# Patient Record
Sex: Male | Born: 1980 | Hispanic: No | Marital: Single | Smoking: Current every day smoker
Health system: Southern US, Community
[De-identification: ages and names within clinical notes are randomized; demographics above are authoritative.]

## PROBLEM LIST (undated history)

## (undated) DIAGNOSIS — I10 Essential (primary) hypertension: Secondary | ICD-10-CM

## (undated) DIAGNOSIS — E119 Type 2 diabetes mellitus without complications: Secondary | ICD-10-CM

---
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
## 2001-05-19 ENCOUNTER — Emergency Department (HOSPITAL_COMMUNITY): Admission: EM | Admit: 2001-05-19 | Discharge: 2001-05-19 | Payer: Self-pay | Admitting: Emergency Medicine

## 2001-05-19 ENCOUNTER — Encounter: Payer: Self-pay | Admitting: Emergency Medicine

## 2012-09-25 ENCOUNTER — Encounter (HOSPITAL_COMMUNITY): Payer: Self-pay | Admitting: Emergency Medicine

## 2012-09-25 ENCOUNTER — Emergency Department (HOSPITAL_COMMUNITY)
Admission: EM | Admit: 2012-09-25 | Discharge: 2012-09-25 | Disposition: A | Discharge status: Home / Self Care | Payer: Self-pay | Attending: Emergency Medicine | Admitting: Emergency Medicine

## 2012-09-25 DIAGNOSIS — S39012A Strain of muscle, fascia and tendon of lower back, initial encounter: Secondary | ICD-10-CM

## 2012-09-25 DIAGNOSIS — Y9389 Activity, other specified: Secondary | ICD-10-CM | POA: Insufficient documentation

## 2012-09-25 DIAGNOSIS — X503XXA Overexertion from repetitive movements, initial encounter: Secondary | ICD-10-CM | POA: Insufficient documentation

## 2012-09-25 DIAGNOSIS — F17200 Nicotine dependence, unspecified, uncomplicated: Secondary | ICD-10-CM | POA: Insufficient documentation

## 2012-09-25 DIAGNOSIS — Y929 Unspecified place or not applicable: Secondary | ICD-10-CM | POA: Insufficient documentation

## 2012-09-25 DIAGNOSIS — S335XXA Sprain of ligaments of lumbar spine, initial encounter: Secondary | ICD-10-CM | POA: Insufficient documentation

## 2012-09-25 MED ORDER — IBUPROFEN 800 MG PO TABS: 800.0000 mg | ORAL_TABLET | Freq: Three times a day (TID) | ORAL | Status: DC

## 2012-09-25 MED ORDER — METHOCARBAMOL 500 MG PO TABS: 500.0000 mg | ORAL_TABLET | Freq: Two times a day (BID) | ORAL | Status: DC

## 2012-09-25 MED ORDER — IBUPROFEN 400 MG PO TABS
800.0000 mg | ORAL_TABLET | Freq: Once | ORAL | Status: AC
  Administered 2012-09-25: 800 mg via ORAL
  Filled 2012-09-25: qty 2

## 2012-09-25 MED ORDER — METHOCARBAMOL 500 MG PO TABS
500.0000 mg | ORAL_TABLET | Freq: Once | ORAL | Status: AC
  Administered 2012-09-25: 500 mg via ORAL
  Filled 2012-09-25: qty 1

## 2012-09-25 NOTE — ED Notes (Signed)
Pt. Stated, My back is hurting, I don't know If its a pulled muscle or what.

## 2012-09-25 NOTE — ED Provider Notes (Signed)
History     CSN: [id number]  Arrival date & time 09/25/12  0903   First MD Initiated Contact with Patient 09/25/12 [phone]      Chief Complaint  Patient presents with  . Back Pain    (Consider location/radiation/quality/duration/timing/severity/associated sxs/prior treatment) HPI Comments: Sharp right-sided low back pain x2 days. Pain is nonradiating and worse with movement and palpation. Patient has tried Tylenol without relief. Patient ambulatory without difficulty. Patient denies bowel or bladder incontinence and saddle anesthesia as well as numbness or tingling in his extremities, extremity weakness, and inability to ambulate. Also denies direct recent trauma or injury to his back but has been doing a lot of lifting lately.  Patient is a 32 y.o. male presenting with back pain. The history is provided by the patient. No language interpreter was used.  Back Pain Associated symptoms: no fever, no numbness and no weakness     History reviewed. No pertinent past medical history.  History reviewed. No pertinent past surgical history.  No family history on file.  History  Substance Use Topics  . Smoking status: Current Every Day Smoker  . Smokeless tobacco: Not on file  . Alcohol Use: No      Review of Systems  Constitutional: Negative for fever.  Musculoskeletal: Positive for back pain. Negative for gait problem.  Skin: Negative for color change and wound.  Neurological: Negative for weakness and numbness.  All other systems reviewed and are negative.    Allergies  Review of patient's allergies indicates no known allergies.  Home Medications   Current Outpatient Rx  Name  Route  Sig  Dispense  Refill  . acetaminophen (TYLENOL) 500 MG tablet   Oral   Take 1,000 mg by mouth every 6 (six) hours as needed for pain (For back pain.).         Marland Kitchen ibuprofen (ADVIL,MOTRIN) 800 MG tablet   Oral   Take 1 tablet (800 mg total) by mouth 3 (three) times daily.   21 tablet  0   . methocarbamol (ROBAXIN) 500 MG tablet   Oral   Take 1 tablet (500 mg total) by mouth 2 (two) times daily.   20 tablet   0     BP 160/96  Pulse 72  Temp(Src) 97.8 F (36.6 C) (Oral)  Resp 16  SpO2 96%  Physical Exam  Nursing note and vitals reviewed. Constitutional: He is oriented to person, place, and time. He appears well-developed and well-nourished. No distress.  HENT:  Head: Normocephalic and atraumatic.  Eyes: Conjunctivae and EOM are normal. No scleral icterus.  Neck: Normal range of motion.  Cardiovascular: Normal rate, regular rhythm and intact distal pulses.   DP and PT pulses 2+ b/l. Capillary refill normal.  Pulmonary/Chest: Effort normal. No respiratory distress.  Musculoskeletal:       Cervical back: Normal.       Thoracic back: Normal.       Lumbar back: He exhibits tenderness and spasm. He exhibits normal range of motion, no bony tenderness, no swelling, no deformity and no pain.       Back:  TTP of lumbar paraspinal muscles with spasm. No cervical, thoracic, or lumbar midline TTP. No bony deformities or step offs palpated.  Neurological: He is alert and oriented to person, place, and time. He has normal reflexes.  No sensory or motor deficits. Patient ambulatory with normal gait.  Skin: Skin is warm and dry. No rash noted. He is not diaphoretic. No erythema. No pallor.  Psychiatric: He has a normal mood and affect. His behavior is normal.    ED Course  Procedures (including critical care time)  Labs Reviewed - No data to display No results found.   1. Low back strain, initial encounter     MDM  Uncomplicated low back strain - Patient neurovascularly intact and ambulatory. No signs concerning for cauda equina. No midline tenderness or hx of direct trauma/blow/fall. Patient appropriate for d/c with orthopedic follow up if symptoms do not improve with ice, ibuprofen, and robaxin. Indications for ED return discussed with patient who verbalizes  comfort and understanding with plan.        Antony Madura, PA-C 09/30/12 1131

## 2012-09-25 NOTE — ED Notes (Signed)
Pt states he hurt his back 2 days ago after lifting something heavy. C/O right lower back pain.

## 2012-10-05 NOTE — ED Provider Notes (Signed)
Medical screening examination/treatment/procedure(s) were performed by non-physician practitioner and as supervising physician I was immediately available for consultation/collaboration.  Opal Dinning, MD 10/05/12 0732 

## 2012-10-16 ENCOUNTER — Emergency Department (HOSPITAL_COMMUNITY)
Admission: EM | Admit: 2012-10-16 | Discharge: 2012-10-16 | Disposition: A | Discharge status: Home / Self Care | Payer: Self-pay | Attending: Emergency Medicine | Admitting: Emergency Medicine

## 2012-10-16 ENCOUNTER — Emergency Department (HOSPITAL_COMMUNITY): Payer: Self-pay

## 2012-10-16 ENCOUNTER — Encounter (HOSPITAL_COMMUNITY): Payer: Self-pay | Admitting: Adult Health

## 2012-10-16 DIAGNOSIS — IMO0002 Reserved for concepts with insufficient information to code with codable children: Secondary | ICD-10-CM | POA: Insufficient documentation

## 2012-10-16 DIAGNOSIS — F17200 Nicotine dependence, unspecified, uncomplicated: Secondary | ICD-10-CM | POA: Insufficient documentation

## 2012-10-16 DIAGNOSIS — Y9389 Activity, other specified: Secondary | ICD-10-CM | POA: Insufficient documentation

## 2012-10-16 DIAGNOSIS — W19XXXA Unspecified fall, initial encounter: Secondary | ICD-10-CM

## 2012-10-16 DIAGNOSIS — Y92009 Unspecified place in unspecified non-institutional (private) residence as the place of occurrence of the external cause: Secondary | ICD-10-CM | POA: Insufficient documentation

## 2012-10-16 DIAGNOSIS — W138XXA Fall from, out of or through other building or structure, initial encounter: Secondary | ICD-10-CM | POA: Insufficient documentation

## 2012-10-16 DIAGNOSIS — M549 Dorsalgia, unspecified: Secondary | ICD-10-CM

## 2012-10-16 MED ORDER — HYDROCODONE-ACETAMINOPHEN 5-325 MG PO TABS
1.0000 | ORAL_TABLET | Freq: Once | ORAL | Status: AC
  Administered 2012-10-16: 1 via ORAL
  Filled 2012-10-16: qty 1

## 2012-10-16 MED ORDER — DIAZEPAM 5 MG PO TABS
5.0000 mg | ORAL_TABLET | Freq: Once | ORAL | Status: AC
  Administered 2012-10-16: 5 mg via ORAL
  Filled 2012-10-16: qty 1

## 2012-10-16 MED ORDER — DIAZEPAM 5 MG PO TABS: 5.0000 mg | ORAL_TABLET | Freq: Two times a day (BID) | ORAL | Status: DC

## 2012-10-16 MED ORDER — HYDROCODONE-ACETAMINOPHEN 5-325 MG PO TABS: 1.0000 | ORAL_TABLET | ORAL | Status: DC | PRN

## 2012-10-16 NOTE — ED Notes (Addendum)
Presents post fall from a one story roof home onto grass landed on back, denies hitting head, denies LOC denies neck pain. C/o lower back pain. Able to ambulate and maex4. Pain is worse with ambulation and better with rest. Denies numbness and tingling. Accident occurred at noon today.

## 2012-10-16 NOTE — ED Provider Notes (Signed)
Medical screening examination/treatment/procedure(s) were performed by non-physician practitioner and as supervising physician I was immediately available for consultation/collaboration.   Dail Meece, MD 10/16/12 2324 

## 2012-10-16 NOTE — ED Provider Notes (Signed)
History  This chart was scribed for non-physician practitioner working with [name], MD. This patient was seen in room TR10C/TR10C and the patient's care was started at 9:18 PM.  CSN: [id number]  Arrival date & time 10/16/12  2055   Chief Complaint  Patient presents with  . Fall    The history is provided by the patient. No language interpreter was used.   HPI Comments: Joshua Mcneil is a 32 y.o. male who presents to the Emergency Department complaining of sudden onset, gradually worsening, constant, moderate-to-severe, non-radiating middle lower back pain onset after an accidental fall that occurred 9 hours ago. Pt states that he was working on a roof of a one story building, when he fell backwards off of the roof and landed with his back on the grass below. Pt states that he landed on his back and his right side. He denies head injury or LOC. He does not have any cuts, scrapes or bruises. Pt states that his pain is relieved with rest and worsened with movement. Pt rates his pain at a "6/10" at rest and a "9/10" with aggravating movements. Denies neck pain, chest pain, nausea, vomiting,  numbness, weakness, tingling, bowel incontinence, bladder incontinence and saddle anaesthesia.  PCP- None   History reviewed. No pertinent past medical history.  History reviewed. No pertinent past surgical history.  History reviewed. No pertinent family history.  History  Substance Use Topics  . Smoking status: Current Every Day Smoker  . Smokeless tobacco: Not on file  . Alcohol Use: No    Review of Systems  Constitutional: Negative for fever and chills.  HENT: Negative for congestion, sore throat, rhinorrhea and neck pain.   Eyes: Negative for visual disturbance.  Respiratory: Negative for cough and shortness of breath.   Cardiovascular: Negative for chest pain.  Gastrointestinal: Negative for nausea, vomiting, abdominal pain and diarrhea.  Genitourinary: Negative for  dysuria.  Musculoskeletal: Positive for back pain.  Skin: Negative for rash.  Neurological: Negative for syncope and headaches.  Psychiatric/Behavioral: Negative for confusion.  A complete 10 system review of systems was obtained and all systems are negative except as noted in the HPI and PMH.   Allergies  Review of patient's allergies indicates no known allergies.  Home Medications   Current Outpatient Rx  Name  Route  Sig  Dispense  Refill  . acetaminophen (TYLENOL) 500 MG tablet   Oral   Take 1,000 mg by mouth every 6 (six) hours as needed for pain (For back pain.).         Marland Kitchen ibuprofen (ADVIL,MOTRIN) 800 MG tablet   Oral   Take 1 tablet (800 mg total) by mouth 3 (three) times daily.   21 tablet   0   . methocarbamol (ROBAXIN) 500 MG tablet   Oral   Take 1 tablet (500 mg total) by mouth 2 (two) times daily.   20 tablet   0    Triage Vitals: BP 128/70  Pulse 99  Temp(Src) 97.8 F (36.6 C) (Oral)  Resp 18  SpO2 99%  Physical Exam  Nursing note and vitals reviewed. Constitutional: He is oriented to person, place, and time. He appears well-developed and well-nourished. No distress.  HENT:  Head: Normocephalic and atraumatic.  Eyes: Conjunctivae and EOM are normal.  Neck: Normal range of motion. Neck supple.  Cardiovascular: Normal rate, regular rhythm and normal heart sounds.   Pulmonary/Chest: Effort normal and breath sounds normal.  Musculoskeletal: Normal range of motion. He exhibits no  edema.  Non-tender in his lumbosacral spine. Pain to lower back when raising right leg. Full lumbar ROM. Slow but normal gait.  Neurological: He is alert and oriented to person, place, and time.  Sensation intact. Strength LE 5/5 and equal bilaterally.  Skin: Skin is warm and dry.  No bruising, ecchymosis, abrasions or lacerations.  Psychiatric: He has a normal mood and affect. His behavior is normal.    ED Course  Procedures (including critical care time)  DIAGNOSTIC  STUDIES: Oxygen Saturation is 99% on RA, normal by my interpretation.    COORDINATION OF CARE: 9:26 PM- Pt advised of plan for treatment and pt agrees.  Medications  diazepam (VALIUM) tablet 5 mg (5 mg Oral Given 10/16/12 2130)  HYDROcodone-acetaminophen (NORCO/VICODIN) 5-325 MG per tablet 1 tablet (1 tablet Oral Given 10/16/12 2130)    Labs Reviewed - No data to display  Dg Lumbar Spine Complete  10/16/2012   *RADIOLOGY REPORT*  Clinical Data: Low back pain after fall earlier today.  LUMBAR SPINE - COMPLETE 4+ VIEW  Comparison: None.  Findings: Five lumbar type vertebrae.  Normal alignment of the lumbar vertebrae and facet joints.  Degenerative changes at L2-3 with mild disc space narrowing and endplate hypertrophic changes. No vertebral compression deformities.  No focal bone lesion or bone destruction.  Bone cortex and trabecular architecture appear intact.  IMPRESSION: No acute displaced fractures demonstrated in the lumbar spine. Mild degenerative changes at L2-3 level.   Original Report Authenticated By: Burman Nieves, M.D.   Dg Sacrum/coccyx  10/16/2012   *RADIOLOGY REPORT*  Clinical Data: Low back pain after fall earlier today.  SACRUM AND COCCYX - 2+ VIEW  Comparison: None.  Findings: The SI joints and sacral struts appear intact and symmetrical.  Normal alignment of the sacral coccygeal spine.  No focal bone lesion or bone destruction.  The bone cortex appears intact.  IMPRESSION: No displaced fractures identified in the sacral coccygeal spine.   Original Report Authenticated By: Burman Nieves, M.D.    1. Fall, initial encounter   2. Back pain     MDM  Patient with low-back pain after sustaining a fall. X-rays normal. He is able to ambulate slowly but normally. No flags concerning patient's back pain. No signs of cauda equina or focal neurologic deficits. Discharge with Vicodin and Valium. Return precautions discussed. Patient states understanding of plan and is  agreeable.      I personally performed the services described in this documentation, which was scribed in my presence. The recorded information has been reviewed and is accurate.   Trevor Mace, PA-C 10/16/12 2213

## 2016-01-04 ENCOUNTER — Ambulatory Visit (INDEPENDENT_AMBULATORY_CARE_PROVIDER_SITE_OTHER): Payer: Worker's Compensation

## 2016-01-04 ENCOUNTER — Ambulatory Visit (INDEPENDENT_AMBULATORY_CARE_PROVIDER_SITE_OTHER): Payer: Worker's Compensation | Admitting: Physician Assistant

## 2016-01-04 ENCOUNTER — Encounter: Payer: Self-pay | Admitting: Physician Assistant

## 2016-01-04 VITALS — BP 132/88 | HR 104 | Temp 98.7°F | Resp 16 | Wt 288.4 lb

## 2016-01-04 DIAGNOSIS — W132XXA Fall from, out of or through roof, initial encounter: Secondary | ICD-10-CM

## 2016-01-04 DIAGNOSIS — M25572 Pain in left ankle and joints of left foot: Secondary | ICD-10-CM

## 2016-01-04 DIAGNOSIS — R0781 Pleurodynia: Secondary | ICD-10-CM

## 2016-01-04 DIAGNOSIS — Z0289 Encounter for other administrative examinations: Secondary | ICD-10-CM

## 2016-01-04 DIAGNOSIS — M5489 Other dorsalgia: Secondary | ICD-10-CM

## 2016-01-04 DIAGNOSIS — M542 Cervicalgia: Secondary | ICD-10-CM

## 2016-01-04 DIAGNOSIS — Z026 Encounter for examination for insurance purposes: Secondary | ICD-10-CM

## 2016-01-04 DIAGNOSIS — R0789 Other chest pain: Secondary | ICD-10-CM

## 2016-01-04 LAB — POCT URINALYSIS DIP (MANUAL ENTRY)
BILIRUBIN UA: NEGATIVE
Blood, UA: NEGATIVE
Glucose, UA: NEGATIVE
Ketones, POC UA: NEGATIVE
LEUKOCYTES UA: NEGATIVE
NITRITE UA: NEGATIVE
PH UA: 8.5
PROTEIN UA: NEGATIVE
Spec Grav, UA: 1.02
Urobilinogen, UA: 1

## 2016-01-04 MED ORDER — CYCLOBENZAPRINE HCL 10 MG PO TABS: 5.0000 mg | ORAL_TABLET | Freq: Three times a day (TID) | ORAL | 0 refills | Status: DC | PRN

## 2016-01-04 MED ORDER — MELOXICAM 15 MG PO TABS: 15.0000 mg | ORAL_TABLET | Freq: Every day | ORAL | 0 refills | Status: DC

## 2016-01-04 NOTE — Patient Instructions (Signed)
Okay to take 1000 mg of Tylenol every eight hours as needed for pain along with Meloxicam and Flexeril as prescribed.

## 2016-01-04 NOTE — Progress Notes (Signed)
01/04/2016 2:46 PM   DOB: 12/21/1980 / MRN: [id number]  SUBJECTIVE:  Joshua ClauseJose Carlos Mcneil is a 35 y.o. male presenting for ankle pain and back pain that started today after falling off of a roof from a 10 foot heigth.  He complains of generalized left sided ankle pain but is ambulating without difficulty.  He complains of cervical, thoracic, and lumbar pain along with right sided rib pain.   He has No Known Allergies.   He  has no past medical history on file.    He  reports that he has been smoking.  He does not have any smokeless tobacco history on file. He reports that he does not drink alcohol or use drugs. He  has no sexual activity history on file. The patient  has no past surgical history on file.  His family history is not on file.  Review of Systems  Constitutional: Negative for chills and fever.  Respiratory: Negative for cough.   Cardiovascular: Negative for chest pain.  Gastrointestinal: Negative for abdominal pain.  Genitourinary: Negative for hematuria.  Musculoskeletal: Positive for back pain, falls, joint pain, myalgias and neck pain.  Skin: Negative for itching and rash.  Neurological: Negative for dizziness, tingling, tremors, sensory change, speech change, focal weakness, seizures, loss of consciousness and headaches.    The problem list and medications were reviewed and updated by myself where necessary and exist elsewhere in the encounter.   OBJECTIVE:  BP 132/88 (Cuff Size: Large)   Pulse (!) 104   Temp 98.7 F (37.1 C)   Resp 16   Wt 288 lb 6.4 oz (130.8 kg)   SpO2 96%   Physical Exam  Constitutional: He is oriented to person, place, and time. He appears well-developed and well-nourished.  Cardiovascular: Normal rate, regular rhythm and normal heart sounds.   Pulmonary/Chest: Effort normal and breath sounds normal.  Musculoskeletal: Normal range of motion.  Neurological: He is alert and oriented to person, place, and time.  Skin: Skin is warm and dry.     Results for orders placed or performed in visit on 01/04/16 (from the past 72 hour(s))  POCT urinalysis dipstick     Status: None   Collection Time: 01/04/16  1:30 PM  Result Value Ref Range   Color, UA yellow yellow   Clarity, UA clear clear   Glucose, UA negative negative   Bilirubin, UA negative negative   Ketones, POC UA negative negative   Spec Grav, UA 1.020    Blood, UA negative negative   pH, UA 8.5    Protein Ur, POC negative negative   Urobilinogen, UA 1.0    Nitrite, UA Negative Negative   Leukocytes, UA Negative Negative    Dg Ribs Unilateral Right  Result Date: 01/04/2016 CLINICAL DATA:  Fall from roof today, right rib pain EXAM: RIGHT RIBS - 2 VIEW COMPARISON:  None. FINDINGS: Three views right ribs submitted. No infiltrate or pulmonary edema. Mild elevation of the right hemidiaphragm. No definite rib fracture is identified. There is no pneumothorax. IMPRESSION: No definite rib fracture is identified. No pneumothorax. No infiltrate or pulmonary edema. Electronically Signed   By: Natasha MeadLiviu  Pop M.D.   On: 01/04/2016 14:34   Dg Cervical Spine 2 Or 3 Views  Result Date: 01/04/2016 CLINICAL DATA:  Back and neck pain after fall from roof. Initial encounter. EXAM: CERVICAL SPINE - 2-3 VIEW COMPARISON:  None. FINDINGS: There is no evidence of cervical spine fracture or prevertebral soft tissue swelling. Alignment is normal.  No other significant bone abnormalities are identified. Limited visualization of C7 and cervicothoracic junction on swimmer's view. IMPRESSION: 1. No evidence of injury. 2. Limited visualization of the craniocervical junction. Electronically Signed   By: [name] M.D.   On: 01/04/2016 14:28   Dg Thoracic Spine 2 View  Result Date: 01/04/2016 CLINICAL DATA:  Fall from roof with back pain.  Initial encounter. EXAM: THORACIC SPINE 2 VIEWS COMPARISON:  None. FINDINGS: There is no evidence of thoracic spine fracture. Alignment is normal. No other significant  bone abnormalities are identified. IMPRESSION: Negative. Electronically Signed   By: [name] M.D.   On: 01/04/2016 14:29   Dg Lumbar Spine 2-3 Views  Result Date: 01/04/2016 CLINICAL DATA:  Fall from roof with back pain.  Initial encounter. EXAM: LUMBAR SPINE - 2-3 VIEW COMPARISON:  10/16/2012 FINDINGS: Chronic, physiologic appearing wedging at L1. Progressive spondylosis and disc narrowing at L2-3. No acute fracture or traumatic malalignment. IMPRESSION: 1. No acute finding. 2. Progressive spondylosis and disc narrowing at L2-3 when compared to 2014. Electronically Signed   By: [name] M.D.   On: 01/04/2016 14:31   Dg Ankle Complete Left  Result Date: 01/04/2016 CLINICAL DATA:  Status post fall with left ankle injury and left ankle pain. Fell 10 feet from roof while working. EXAM: LEFT ANKLE COMPLETE - 3+ VIEW COMPARISON:  None. FINDINGS: There is no acute fracture or dislocation. The ankle mortise is approximated. There is moderate circumferential soft tissue swelling. The visualized joint spaces of the midfoot and hindfoot are maintained. IMPRESSION: No fracture or dislocation of the left ankle. Electronically Signed   By: Deatra Robinson M.D.   On: 01/04/2016 14:28    ASSESSMENT AND PLAN  Marice was seen today for fall.  Diagnoses and all orders for this visit:   Encounter related to worker's compensation claim.   Left ankle pain: Rads are negative.  I have advised that he will be out of work at there very least until next Monday for follow up.  Mobic and flexeril for pain.  -     DG Ankle Complete Left; Future  Neck pain -     DG Cervical Spine 2 or 3 views; Future  Rib pain on right side -     DG Thoracic Spine 2 View; Future -     DG Ribs Unilateral Right; Future  Midline back pain, unspecified location -     DG Lumbar Spine 2-3 Views; Future  Fall from roof, initial encounter -     POCT urinalysis dipstick -     Recheck vitals -     meloxicam (MOBIC) 15 MG  tablet; Take 1 tablet (15 mg total) by mouth daily. -     cyclobenzaprine (FLEXERIL) 10 MG tablet; Take 0.5-1 tablets (5-10 mg total) by mouth 3 (three) times daily as needed for muscle spasms (May cause drowsiness. Do no operate heavy machinery while taking.).    The patient is advised to call or return to clinic if he does not see an improvement in symptoms, or to seek the care of the closest emergency department if he worsens with the above plan.   Deliah Boston, MHS, PA-C [facility] 01/04/2016 2:46 PM

## 2016-01-08 ENCOUNTER — Ambulatory Visit (INDEPENDENT_AMBULATORY_CARE_PROVIDER_SITE_OTHER): Payer: Worker's Compensation | Admitting: Urgent Care

## 2016-01-08 VITALS — BP 120/80 | HR 79 | Temp 98.2°F | Resp 18 | Ht 70.0 in | Wt 283.2 lb

## 2016-01-08 DIAGNOSIS — M25572 Pain in left ankle and joints of left foot: Secondary | ICD-10-CM

## 2016-01-08 DIAGNOSIS — W132XXD Fall from, out of or through roof, subsequent encounter: Secondary | ICD-10-CM

## 2016-01-08 DIAGNOSIS — Z026 Encounter for examination for insurance purposes: Secondary | ICD-10-CM

## 2016-01-08 DIAGNOSIS — M542 Cervicalgia: Secondary | ICD-10-CM

## 2016-01-08 DIAGNOSIS — S9032XD Contusion of left foot, subsequent encounter: Secondary | ICD-10-CM | POA: Diagnosis not present

## 2016-01-08 NOTE — Patient Instructions (Addendum)
RHCE para los cuidados de rutina de las lesiones (RICE for Routine Care of Injuries) Los cuidados de rutina de muchas lesiones incluyen reposo, hielo, compresin y elevacin (RHCE). A menudo se recomienda la estrategia de RHCE para las lesiones de los tejidos blandos, por ejemplo, una distensin muscular, las lesiones de los ligamentos, los hematomas y las lesiones por uso excesivo. Tambin se puede utilizar para [employer]. El uso de la Economist de RHCE puede ayudar a Engineer, materials, reducir la hinchazn y permitir que el cuerpo se recupere. Reposo El reposo es necesario para permitir que el cuerpo se recupere. Habitualmente, esto implica reducir las [address] normales y Automotive engineer el uso de la zona lesionada del cuerpo. Por lo general, puede reanudar sus actividades normales cuando se siente cmodo y el mdico se lo ha autorizado. Hielo Aplicar hielo en una lesin sirve para evitar la hinchazn y Engineer, materials. No aplique el hielo directamente sobre la piel.  Ponga el hielo en una bolsa plstica.  Coloque una toalla entre la piel y la bolsa de hielo.  Coloque el hielo durante 20 minutos, 2 a 3 veces por da. Hgalo durante el tiempo que el mdico se lo haya indicado. Compresin La compresin implica ejercer presin sobre la zona lesionada. La compresin ayuda a evitar la hinchazn, brinda sujecin y [place] con las [address] Una venda elstica sirve para la compresin. Si tiene IT consultant, siga estos consejos generales:  Qutese y vuelva a colocarse la venda cada 3 o 4horas, o como se lo haya indicado el mdico.  Asegrese de que la venda no est muy [place], ya que esto puede cortarle la circulacin. Si una parte del cuerpo ms all de la venda se torna color azul, se adormece, se enfra, se hincha o le causa ms dolor, es probable que la venda est [place]. Si eso ocurre, retire la venda y vuelva a colocarla ms floja.  Consulte al mdico si la venda parece  estar agravando los problemas en lugar de mejorndolos. Elevacin La elevacin implica mantener elevada la zona lesionada. Esto ayuda a Engineer, materials y Building services engineer. Si es posible, la zona lesionada debe elevarse al nivel del corazn o del centro del pecho, o por encima de [place]. CUNDO DEBO BUSCAR ATENCIN MDICA? Debe buscar atencin mdica en las siguientes situaciones:  El dolor y la hinchazn continan.  Los sntomas empeoran en vez de Scientist, clinical (histocompatibility and immunogenetics). Estos sntomas pueden indicar que es necesaria una evaluacin ms profunda o nuevas radiografas. En algunos casos, las radiografas no muestran si hay un hueso pequeo roto (fractura) hasta varios das ms tarde. Concurra a las citas de control con el mdico. CUNDO DEBO BUSCAR ASISTENCIA MDICA INMEDIATA? Solicite atencin [employer] en las siguientes situaciones:  Siente un dolor intenso repentino en la zona de la lesin o por debajo de esta.  Tiene enrojecimiento o aumenta la hinchazn alrededor de la lesin.  Tiene hormigueo o adormecimiento en la zona de la lesin o por debajo de esta que no mejoran despus de quitarse la venda Adult nurse.   Esta informacin no tiene Theme park manager el consejo del mdico. Asegrese de hacerle al mdico cualquier pregunta que tenga.   Document Released: 01/02/2005 Document Revised: 03/30/2013 Elsevier Interactive Patient Education [employer]     IF you received an x-ray today, you will receive an invoice from [facility]. Please contact [facility] at [phone] with questions or concerns regarding your invoice.   IF you received labwork today,  you will receive an invoice from [facility]. Please contact [facility] at [phone] with questions or concerns regarding your invoice.   Our billing staff will not be able to assist you with questions regarding bills from these companies.  You will be contacted with the lab  results as soon as they are available. The fastest way to get your results is to activate your My Chart account. Instructions are located on the last page of this paperwork. If you have not heard from us regarding the results in 2 weeks, please contact this office.

## 2016-01-08 NOTE — Progress Notes (Signed)
    MRN: [id number] DOB: 01/16/1981  Subjective:   Joshua Mcneil is a 35 y.o. male presenting for worker's comp visit.   Reports ongoing left ankle pain. He continues to have pain of his left ankle, aggravated by dorsiflexion of his foot or pivoting. His ankle swelling has improved but still has bruising. His back pain is largely improved, still has very mild neck pain. Has tried meloxicam and Flexeril with some relief. Denies re-injury of his left ankle, headache, confusion.   Joshua Mcneil's medications list, allergies, past medical history and past surgical history were reviewed and excluded from this note due to being a worker's comp case.  Objective:   Vitals: BP 120/80   Pulse 79   Temp 98.2 F (36.8 C) (Oral)   Resp 18   Ht 5\' 10"  (1.778 m)   Wt 283 lb 3.2 oz (128.5 kg)   SpO2 97%   BMI 40.63 kg/m   Physical Exam  Constitutional: He is oriented to person, place, and time. He appears well-developed and well-nourished.  Cardiovascular: Normal rate.   Pulmonary/Chest: Effort normal.  Musculoskeletal:       Left ankle: He exhibits swelling (trace over lateral malleolus). He exhibits normal range of motion, no ecchymosis, no deformity, no laceration and normal pulse. Tenderness (heel of left foot). AITFL tenderness found. No lateral malleolus, no medial malleolus, no CF ligament, no posterior TFL, no head of 5th metatarsal and no proximal fibula tenderness found. Achilles tendon exhibits no pain and no defect.  Neurological: He is alert and oriented to person, place, and time.   Assessment and Plan :   1. Encounter related to worker's compensation claim 2. Fall from roof, subsequent encounter 3. Acute left ankle pain 4. Contusion of foot or heel, left, subsequent encounter - Improved, will release to work on light duty. Continue with conservative management. RTC in 1 week if no improvement.  5. Neck pain - Significantly improved  Joshua BambergMario Joshua Longmore, PA-C [facility] [phone] 01/08/2016 1:13 PM

## 2016-01-15 ENCOUNTER — Other Ambulatory Visit: Payer: Self-pay

## 2016-09-16 ENCOUNTER — Other Ambulatory Visit: Payer: Self-pay | Admitting: Internal Medicine

## 2016-09-16 ENCOUNTER — Ambulatory Visit
Admission: RE | Admit: 2016-09-16 | Discharge: 2016-09-16 | Disposition: A | Discharge status: Home / Self Care | Payer: No Typology Code available for payment source | Source: Ambulatory Visit | Attending: Internal Medicine | Admitting: Internal Medicine

## 2016-09-16 DIAGNOSIS — Z111 Encounter for screening for respiratory tuberculosis: Secondary | ICD-10-CM

## 2020-08-24 ENCOUNTER — Ambulatory Visit
Admission: RE | Admit: 2020-08-24 | Discharge: 2020-08-24 | Disposition: A | Discharge status: Home / Self Care | Payer: No Typology Code available for payment source | Source: Ambulatory Visit | Attending: Student in an Organized Health Care Education/Training Program | Admitting: Student in an Organized Health Care Education/Training Program

## 2020-08-24 ENCOUNTER — Other Ambulatory Visit: Payer: Self-pay | Admitting: Student in an Organized Health Care Education/Training Program

## 2020-08-24 DIAGNOSIS — R0789 Other chest pain: Secondary | ICD-10-CM

## 2021-08-20 ENCOUNTER — Encounter (HOSPITAL_COMMUNITY): Payer: Self-pay

## 2021-08-20 ENCOUNTER — Emergency Department (HOSPITAL_COMMUNITY): Payer: No Typology Code available for payment source

## 2021-08-20 ENCOUNTER — Other Ambulatory Visit: Payer: Self-pay

## 2021-08-20 ENCOUNTER — Inpatient Hospital Stay (HOSPITAL_COMMUNITY)
Admission: EM | Admit: 2021-08-20 | Discharge: 2021-08-21 | DRG: 153 | Disposition: A | Discharge status: Home / Self Care | Payer: No Typology Code available for payment source | Attending: Otolaryngology | Admitting: Otolaryngology

## 2021-08-20 DIAGNOSIS — H9201 Otalgia, right ear: Secondary | ICD-10-CM | POA: Diagnosis present

## 2021-08-20 DIAGNOSIS — I10 Essential (primary) hypertension: Secondary | ICD-10-CM

## 2021-08-20 DIAGNOSIS — J36 Peritonsillar abscess: Principal | ICD-10-CM | POA: Diagnosis present

## 2021-08-20 DIAGNOSIS — E1165 Type 2 diabetes mellitus with hyperglycemia: Secondary | ICD-10-CM

## 2021-08-20 DIAGNOSIS — R0683 Snoring: Secondary | ICD-10-CM

## 2021-08-20 DIAGNOSIS — T380X5A Adverse effect of glucocorticoids and synthetic analogues, initial encounter: Secondary | ICD-10-CM | POA: Diagnosis present

## 2021-08-20 DIAGNOSIS — F17210 Nicotine dependence, cigarettes, uncomplicated: Secondary | ICD-10-CM | POA: Diagnosis present

## 2021-08-20 DIAGNOSIS — B950 Streptococcus, group A, as the cause of diseases classified elsewhere: Secondary | ICD-10-CM

## 2021-08-20 DIAGNOSIS — Y92239 Unspecified place in hospital as the place of occurrence of the external cause: Secondary | ICD-10-CM | POA: Diagnosis present

## 2021-08-20 DIAGNOSIS — K219 Gastro-esophageal reflux disease without esophagitis: Secondary | ICD-10-CM

## 2021-08-20 DIAGNOSIS — Z7984 Long term (current) use of oral hypoglycemic drugs: Secondary | ICD-10-CM

## 2021-08-20 DIAGNOSIS — E785 Hyperlipidemia, unspecified: Secondary | ICD-10-CM

## 2021-08-20 DIAGNOSIS — A419 Sepsis, unspecified organism: Secondary | ICD-10-CM

## 2021-08-20 DIAGNOSIS — A400 Sepsis due to streptococcus, group A: Principal | ICD-10-CM | POA: Diagnosis present

## 2021-08-20 DIAGNOSIS — Z6841 Body Mass Index (BMI) 40.0 and over, adult: Secondary | ICD-10-CM

## 2021-08-20 DIAGNOSIS — Z720 Tobacco use: Secondary | ICD-10-CM

## 2021-08-20 HISTORY — DX: Type 2 diabetes mellitus without complications: E11.9

## 2021-08-20 HISTORY — DX: Essential (primary) hypertension: I10

## 2021-08-20 LAB — CBC WITH DIFFERENTIAL/PLATELET
Abs Immature Granulocytes: 0.16 10*3/uL — ABNORMAL HIGH (ref 0.00–0.07)
Basophils Absolute: 0.1 10*3/uL (ref 0.0–0.1)
Basophils Relative: 1 %
Eosinophils Absolute: 0.4 10*3/uL (ref 0.0–0.5)
Eosinophils Relative: 3 %
HCT: 45.6 % (ref 39.0–52.0)
Hemoglobin: 16 g/dL (ref 13.0–17.0)
Immature Granulocytes: 1 %
Lymphocytes Relative: 18 %
Lymphs Abs: 2.7 10*3/uL (ref 0.7–4.0)
MCH: 31.6 pg (ref 26.0–34.0)
MCHC: 35.1 g/dL (ref 30.0–36.0)
MCV: 89.9 fL (ref 80.0–100.0)
Monocytes Absolute: 1.3 10*3/uL — ABNORMAL HIGH (ref 0.1–1.0)
Monocytes Relative: 9 %
Neutro Abs: 10.3 10*3/uL — ABNORMAL HIGH (ref 1.7–7.7)
Neutrophils Relative %: 68 %
Platelets: 258 10*3/uL (ref 150–400)
RBC: 5.07 MIL/uL (ref 4.22–5.81)
RDW: 11.9 % (ref 11.5–15.5)
WBC: 14.8 10*3/uL — ABNORMAL HIGH (ref 4.0–10.5)
nRBC: 0 % (ref 0.0–0.2)

## 2021-08-20 LAB — BASIC METABOLIC PANEL
Anion gap: 10 (ref 5–15)
BUN: 7 mg/dL (ref 6–20)
CO2: 22 mmol/L (ref 22–32)
Calcium: 9 mg/dL (ref 8.9–10.3)
Chloride: 104 mmol/L (ref 98–111)
Creatinine, Ser: 0.75 mg/dL (ref 0.61–1.24)
GFR, Estimated: >60 mL/min (ref >60)
Glucose, Bld: 240 mg/dL — ABNORMAL HIGH (ref 70–99)
Potassium: 3.9 mmol/L (ref 3.5–5.1)
Sodium: 136 mmol/L (ref 135–145)

## 2021-08-20 LAB — CBG MONITORING, ED: Glucose-Capillary: 232 mg/dL — ABNORMAL HIGH (ref 70–99)

## 2021-08-20 LAB — HEMOGLOBIN A1C
Hgb A1c MFr Bld: 8.3 % — ABNORMAL HIGH (ref 4.8–5.6)
Mean Plasma Glucose: 191.51 mg/dL

## 2021-08-20 LAB — LACTIC ACID, PLASMA: Lactic Acid, Venous: 1.1 mmol/L (ref 0.5–1.9)

## 2021-08-20 LAB — GROUP A STREP BY PCR: Group A Strep by PCR: DETECTED — AB

## 2021-08-20 LAB — GLUCOSE, CAPILLARY
Glucose-Capillary: 226 mg/dL — ABNORMAL HIGH (ref 70–99)
Glucose-Capillary: 286 mg/dL — ABNORMAL HIGH (ref 70–99)

## 2021-08-20 LAB — HIV ANTIBODY (ROUTINE TESTING W REFLEX): HIV Screen 4th Generation wRfx: NONREACTIVE

## 2021-08-20 MED ORDER — ONDANSETRON HCL 4 MG PO TABS: 4.0000 mg | ORAL_TABLET | Freq: Four times a day (QID) | ORAL | Status: DC | PRN

## 2021-08-20 MED ORDER — IOHEXOL 300 MG/ML  SOLN
100.0000 mL | Freq: Once | INTRAMUSCULAR | Status: AC | PRN
  Administered 2021-08-20: 100 mL via INTRAVENOUS

## 2021-08-20 MED ORDER — SODIUM CHLORIDE 0.9 % IV SOLN
3.0000 g | Freq: Four times a day (QID) | INTRAVENOUS | Status: DC
  Administered 2021-08-20 – 2021-08-21 (×4): 3 g via INTRAVENOUS
  Filled 2021-08-20 (×6): qty 8

## 2021-08-20 MED ORDER — DEXAMETHASONE SODIUM PHOSPHATE 10 MG/ML IJ SOLN
8.0000 mg | Freq: Four times a day (QID) | INTRAMUSCULAR | Status: DC
  Administered 2021-08-20 – 2021-08-21 (×3): 8 mg via INTRAVENOUS
  Filled 2021-08-20 (×4): qty 0.8

## 2021-08-20 MED ORDER — PANTOPRAZOLE SODIUM 40 MG IV SOLR
40.0000 mg | INTRAVENOUS | Status: DC
  Administered 2021-08-20: 40 mg via INTRAVENOUS
  Filled 2021-08-20: qty 10

## 2021-08-20 MED ORDER — HYDROMORPHONE HCL 1 MG/ML IJ SOLN
0.5000 mg | Freq: Once | INTRAMUSCULAR | Status: AC
  Administered 2021-08-20: 0.5 mg via INTRAVENOUS
  Filled 2021-08-20: qty 1

## 2021-08-20 MED ORDER — ONDANSETRON HCL 4 MG/2ML IJ SOLN
4.0000 mg | Freq: Once | INTRAMUSCULAR | Status: AC
  Administered 2021-08-20: 4 mg via INTRAVENOUS
  Filled 2021-08-20: qty 2

## 2021-08-20 MED ORDER — SODIUM CHLORIDE 0.9 % IV SOLN
3.0000 g | Freq: Four times a day (QID) | INTRAVENOUS | Status: DC
  Administered 2021-08-20: 3 g via INTRAVENOUS
  Filled 2021-08-20 (×3): qty 8

## 2021-08-20 MED ORDER — DEXAMETHASONE SODIUM PHOSPHATE 10 MG/ML IJ SOLN
8.0000 mg | Freq: Four times a day (QID) | INTRAMUSCULAR | Status: DC
  Administered 2021-08-20: 8 mg via INTRAVENOUS
  Filled 2021-08-20 (×2): qty 1

## 2021-08-20 MED ORDER — MORPHINE SULFATE (PF) 2 MG/ML IV SOLN
1.0000 mg | INTRAVENOUS | Status: DC | PRN
  Administered 2021-08-20 (×2): 1 mg via INTRAVENOUS
  Filled 2021-08-20 (×2): qty 1

## 2021-08-20 MED ORDER — HYDROMORPHONE HCL 1 MG/ML IJ SOLN
1.0000 mg | Freq: Once | INTRAMUSCULAR | Status: AC
  Administered 2021-08-20: 1 mg via INTRAVENOUS
  Filled 2021-08-20: qty 1

## 2021-08-20 MED ORDER — INSULIN ASPART 100 UNIT/ML IJ SOLN
0.0000 [IU] | Freq: Three times a day (TID) | INTRAMUSCULAR | Status: DC
  Administered 2021-08-20: 5 [IU] via SUBCUTANEOUS
  Administered 2021-08-21: 8 [IU] via SUBCUTANEOUS
  Administered 2021-08-21: 5 [IU] via SUBCUTANEOUS

## 2021-08-20 MED ORDER — LACTATED RINGERS IV SOLN: INTRAVENOUS | Status: AC

## 2021-08-20 MED ORDER — INSULIN GLARGINE-YFGN 100 UNIT/ML ~~LOC~~ SOLN
8.0000 [IU] | Freq: Every day | SUBCUTANEOUS | Status: DC
  Administered 2021-08-20: 8 [IU] via SUBCUTANEOUS
  Filled 2021-08-20 (×3): qty 0.08

## 2021-08-20 MED ORDER — ONDANSETRON HCL 4 MG/2ML IJ SOLN: 4.0000 mg | Freq: Four times a day (QID) | INTRAMUSCULAR | Status: DC | PRN

## 2021-08-20 MED ORDER — HYDRALAZINE HCL 20 MG/ML IJ SOLN: 10.0000 mg | Freq: Three times a day (TID) | INTRAMUSCULAR | Status: DC | PRN

## 2021-08-20 MED ORDER — NICOTINE 14 MG/24HR TD PT24
14.0000 mg | MEDICATED_PATCH | Freq: Every day | TRANSDERMAL | Status: DC
  Administered 2021-08-20 – 2021-08-21 (×2): 14 mg via TRANSDERMAL
  Filled 2021-08-20 (×2): qty 1

## 2021-08-20 NOTE — ED Provider Notes (Signed)
?[name] EMERGENCY DEPARTMENT ?Provider Note ? ? ?CSN: [id number] ?Arrival date & time: 08/20/21  0370 ? ?  ? ?History ?Chief Complaint  ?Patient presents with  ? Facial Swelling  ? Otalgia  ? ? ?Joshua Mcneil is a 41 y.o. male with past medical history of diabetes and obesity who presented with 8 days of throat pain and swelling. ? ?He reports that he began to have fevers around the same time as the swelling started and his pain has been steadily increasing. He has difficulty with swallowing fluids and food. He is able to swallow saliva with pain. He reports difficulty breathing with laying back. He has noticed pain in the right side of his mouth as well. He has not noticed pus.  ? ?The history is provided by the patient and the spouse. The history is limited by a language barrier. A language interpreter was used.  ?Otalgia ?Location:  Right ?Associated symptoms: sore throat   ? ?  ? ?Home Medications ?Prior to Admission medications   ?Medication Sig Start Date End Date Taking? Authorizing Provider  ?acetaminophen (TYLENOL) 500 MG tablet Take 1,000 mg by mouth every 6 (six) hours as needed for mild pain or fever.   Yes [provider]  ?atorvastatin (LIPITOR) 10 MG tablet Take 10 mg by mouth at bedtime. 05/14/21  Yes [provider]  ?glipiZIDE (GLUCOTROL XL) 10 MG 24 hr tablet Take 20 mg by mouth every morning. 07/06/21  Yes [provider]  ?metFORMIN (GLUCOPHAGE) 500 MG tablet Take 500 mg by mouth 2 (two) times daily. 04/03/21  Yes [provider]  ?OVER THE COUNTER MEDICATION Unknown Antibiotic from over seas   Yes [provider]  ?pantoprazole (PROTONIX) 40 MG tablet Take 40 mg by mouth daily. 08/03/21  Yes [provider]  ?   ? ?Allergies    ?Patient has no known allergies.   ? ?Review of Systems   ?Review of Systems  ?HENT:  Positive for ear pain, facial swelling, sore throat, trouble swallowing and voice change.   ?Respiratory:   Negative for stridor.   ? ?Physical Exam ?Updated Vital Signs ?BP (!) 140/93   Pulse 99   Temp 98.3 ?F (36.8 ?C) (Oral)   Resp 15   Ht 5\' 7"  (1.702 m)   Wt 130.2 kg   SpO2 99%   BMI 44.95 kg/m?  ?Constitutional: alert, grimaces with speaking, muffled voice ?HENT: edema present to right mandible, up to right ear, unable to visualize tonsils due to edema, no erythema present, no purulence noted, poor dentition ?Neck: edema present to right and submandibular space ?Cardiovascular: regular rate and rhythm, no m/r/g ?Pulmonary/Chest: normal work of breathing on room air, lungs clear to auscultation bilaterally ?Abdominal: soft, non-tender, non-distended ?MSK: obese ? ? ? ?ED Results / Procedures / Treatments   ?Labs ?(all labs ordered are listed, but only abnormal results are displayed) ?Labs Reviewed  ?GROUP A STREP BY PCR - Abnormal; Notable for the following components:  ?    Result Value  ? Group A Strep by PCR DETECTED (*)   ? All other components within normal limits  ?CBC WITH DIFFERENTIAL/PLATELET - Abnormal; Notable for the following components:  ? WBC 14.8 (*)   ? Neutro Abs 10.3 (*)   ? Monocytes Absolute 1.3 (*)   ? Abs Immature Granulocytes 0.16 (*)   ? All other components within normal limits  ?BASIC METABOLIC PANEL - Abnormal; Notable for the following components:  ? Glucose,  Bld 240 (*)   ? All other components within normal limits  ?CULTURE, BLOOD (ROUTINE X 2)  ?CULTURE, BLOOD (ROUTINE X 2)  ?LACTIC ACID, PLASMA  ?LACTIC ACID, PLASMA  ? ? ?EKG ?None ? ?Radiology ?CT Soft Tissue Neck W Contrast ? ?Result Date: 08/20/2021 ?CLINICAL DATA:  Right-sided face and ear pain beginning a week ago with swelling. Difficulty swelling EXAM: CT NECK WITH CONTRAST TECHNIQUE: Multidetector CT imaging of the neck was performed using the standard protocol following the bolus administration of intravenous contrast. RADIATION DOSE REDUCTION: This exam was performed according to the departmental dose-optimization  program which includes automated exposure control, adjustment of the mA and/or kV according to patient size and/or use of iterative reconstruction technique. CONTRAST:  OMNIPAQUE IOHEXOL 300 MG/ML  SOLN COMPARISON:  None Available. FINDINGS: Pharynx and larynx: Thickening of the tonsils with accentuated enhancement. The right palatine tonsil is asymmetrically thickened from a 16 mm low-density collection. Salivary glands: No inflammation, mass, or stone. Thyroid: Normal. Lymph nodes: Expected thickening of upper jugular chain lymph nodes. No nodal cavitation. Vascular: Negative Limited intracranial: Negative Visualized orbits: Negative Mastoids and visualized paranasal sinuses: Clear Skeleton: No acute or aggressive process. Upper chest: Negative. IMPRESSION: Tonsillitis with 16 mm right peritonsillar abscess. Electronically Signed   By: Tiburcio Pea M.D.   On: 08/20/2021 04:20   ? ? ?Medications Ordered in ED ?Medications  ?Ampicillin-Sulbactam (UNASYN) 3 g in sodium chloride 0.9 % 100 mL IVPB (3 g Intravenous New Bag/Given 08/20/21 1146)  ?HYDROmorphone (DILAUDID) injection 1 mg (has no administration in time range)  ?iohexol (OMNIPAQUE) 300 MG/ML solution 100 mL (100 mLs Intravenous Contrast Given 08/20/21 0359)  ?ondansetron (ZOFRAN) injection 4 mg (4 mg Intravenous Given 08/20/21 1045)  ?HYDROmorphone (DILAUDID) injection 0.5 mg (0.5 mg Intravenous Given 08/20/21 1046)  ? ? ?ED Course/ Medical Decision Making/ A&P ?  ?                        ?Medical Decision Making ?Labs showed WBC of 14.8 with PCR + for group A strep. Glucose elevated at 240 with AG 10. CT showed 16 mm right peritonsillar abscess. Differentials include peritonsillar abscess versus odontogenic infection. ENT was consulted to evaluate for possible drainage of abscess. He was started on ampicillin-sulbactam. I&D completed with no purulent output. He will be admitted with plans for steroids and close monitoring with history of  diabetes. ? ?Amount and/or Complexity of Data Reviewed ?Independent Historian: spouse ?Labs: ordered. ?   Details: WBC ? ? ?Final Clinical Impression(s) / ED Diagnoses ?Final diagnoses:  ?None  ? ? ?Rx / DC Orders ?ED Discharge Orders   ? ? None  ? ?  ? ? ?  ?Chesley Veasey, Florentina Addison, DO ?08/20/21 1339 ? ?  ?Tegeler, Canary Brim, MD ?08/20/21 1518 ? ?

## 2021-08-20 NOTE — Assessment & Plan Note (Signed)
Nicotine patch, encouraged cessation 

## 2021-08-20 NOTE — Assessment & Plan Note (Signed)
Continue protonix>IV 

## 2021-08-20 NOTE — Assessment & Plan Note (Signed)
No A1C in chart, check now ?Hold oral metformin/glipizide ?Start IVF, long acting insulin and SSI, especially given IV steroids  ?Diabetic education  ?

## 2021-08-20 NOTE — Progress Notes (Signed)
?   08/20/21 1839  ?Assess: MEWS Score  ?Temp 99.7 ?F (37.6 ?C)  ?BP (!) 142/92  ?Pulse Rate (!) 116  ?Resp 16  ?Level of Consciousness Alert  ?SpO2 98 %  ?O2 Device Room Air  ?Assess: MEWS Score  ?MEWS Temp 0  ?MEWS Systolic 0  ?MEWS Pulse 2  ?MEWS RR 0  ?MEWS LOC 0  ?MEWS Score 2  ?MEWS Score Color Yellow  ? ?Pt admitted from the ED in yellow MEWS. MEWS protocol initiated on the floor. MD notified ?

## 2021-08-20 NOTE — ED Provider Triage Note (Signed)
Emergency Medicine Provider Triage Evaluation Note ? ?Sajad [address] [place] , a 41 y.o. male  was evaluated in triage.  Pt complains of sore throat.  States for about a week he has had trouble swallowing and severe pain on right side of his throat.  He denies cough, fever, or other URI symptoms.  Denies hx of same.  No meds taken PTA. ? ?Review of Systems  ?Positive: Sore throat, trouble swallowing ?Negative: fever ? ?Physical Exam  ?BP (!) 150/99 (BP Location: Right Arm)   Pulse (!) 102   Temp 98.3 ?F (36.8 ?C) (Oral)   Resp 16   Ht 5\' 7"  (1.702 m)   Wt 130.2 kg   SpO2 100%   BMI 44.95 kg/m?  ? ?Gen:   Awake, no distress   ?Resp:  Normal effort  ?MSK:   Moves extremities without difficulty  ?Other:  Swelling noted to right posterior oropharynx, uvula incompletely visualized, voice is muffled, swelling and tenderness noted to right side of neck ? ?Medical Decision Making  ?Medically screening exam initiated at 2:26 AM.  Appropriate orders placed.  Eben Jiovonni Goggins was informed that the remainder of the evaluation will be completed by another provider, this initial triage assessment does not replace that evaluation, and the importance of remaining in the ED until their evaluation is complete. ? ?Sore throat, difficulty swallowing, facial swelling.  Has muffled voice on exam but handling secretions well without stridor.  Will check labs, CT soft tissue neck. ?  ?Larene Pickett, PA-C ?08/20/21 0249 ? ?

## 2021-08-20 NOTE — H&P (Signed)
?History and Physical  ? ? ?Patient: Joshua Mcneil UJW:[id number] DOB: 10/07/1980 ?DOA: 08/20/2021 ?DOS: the patient was seen and examined on 08/20/2021 ?PCP: Patient, No Pcp Per (Inactive)  ?Patient coming from: Home - lives with wife and kids  ? ? ?Chief Complaint: throat pain and swelling  ?Translation service used for entirety of exam in spanish  ? ?HPI: Joshua Mcneil is a 41 y.o. male with medical history significant of uncontrolled T2DM, HTN who presented to ED with 7 day history of throat pain and swelling.  He also complains of right ear pain and headache. States the pain got progressively worse over the last 3 days prompting them to come to Ed. He has not been able to eat or drink due pain and swelling of throat. He has had fever at home that is subjective. He has had nausea and some vomiting of phelgm x 2 episodes. He started to have a hard time breathing last night, but feels fine after I&D of his throat. Denies anyone else having strep throat or being sick at home.  ? ?Denies any vision changes/headaches, chest pain or palpitations, cough, abdominal pain, diarrhea , dysuria or leg swelling.  ? ? ?He does smoke, does not drink alcohol. Denies any drugs  ? ?ER Course:  vitals: afebrile, bp: 150/99, HR: 102, RR: 16, oxygen: 100%RA ?Pertinent labs: wbc: 14.8, glucose 240, +GAS, lactic acid 1.1, BC pending ?CT soft tissue neck with contrast: tonsillitis with 16mm right peritonsillar abscess.  ?In ED: ENT consulted. Started on unasyn and decadron. ENT made a large incision along the soft palate at the top of the tonsil. Debridement carried out using a curved tonsil clamp, no pus.  ? ? ?Review of Systems: As mentioned in the history of present illness. All other systems reviewed and are negative. ?Past Medical History:  ?Diagnosis Date  ? Diabetes mellitus without complication (HCC)   ? Hypertension   ? ?History reviewed. No pertinent surgical history. ?Social History:  reports that he has been  smoking cigarettes. He has been smoking an average of .5 packs per day. He has never used smokeless tobacco. He reports current alcohol use of about 12.0 standard drinks per week. He reports that he does not use drugs. ? ?No Known Allergies ? ?No family history on file. ? ?Prior to Admission medications   ?Medication Sig Start Date End Date Taking? Authorizing Provider  ?acetaminophen (TYLENOL) 500 MG tablet Take 1,000 mg by mouth every 6 (six) hours as needed for mild pain or fever.   Yes [provider]  ?atorvastatin (LIPITOR) 10 MG tablet Take 10 mg by mouth at bedtime. 05/14/21  Yes [provider]  ?glipiZIDE (GLUCOTROL XL) 10 MG 24 hr tablet Take 20 mg by mouth every morning. 07/06/21  Yes [provider]  ?metFORMIN (GLUCOPHAGE) 500 MG tablet Take 500 mg by mouth 2 (two) times daily. 04/03/21  Yes [provider]  ?OVER THE COUNTER MEDICATION Unknown Antibiotic from over seas   Yes [provider]  ?pantoprazole (PROTONIX) 40 MG tablet Take 40 mg by mouth daily. 08/03/21  Yes [provider]  ? ? ?Physical Exam: ?Vitals:  ? 08/20/21 1000 08/20/21 1100 08/20/21 1130 08/20/21 1215  ?BP: (!) 157/106  (!) 140/93 (!) 157/104  ?Pulse: (!) 102 99 99 91  ?Resp: 18 14 15  (!) 21  ?Temp:      ?TempSrc:      ?SpO2: 96% 95% 99% 95%  ?Weight:      ?Height:      ? ?  General:  Appears calm and comfortable and is in NAD. obese ?Eyes:  PERRL, EOMI, normal lids, iris ?ENT:  grossly normal hearing, lips & tongue, mmm; appropriate dentition. Right sided neck edema into mandible. TTP over right neck. Extensive edema in pharynx, erythema.  ?Neck:  no LAD, masses or thyromegaly; no carotid bruits. Edema or right side of neck  ?Cardiovascular:  RRR, no m/r/g. No LE edema.  ?Respiratory:   CTA bilaterally with no wheezes/rales/rhonchi.  Normal respiratory effort. ?Abdomen:  soft, NT, ND, NABS ?Back:   normal alignment, no CVAT ?Skin:  no rash or induration seen on limited  exam ?Musculoskeletal:  grossly normal tone BUE/BLE, good ROM, no bony abnormality ?Lower extremity:  No LE edema.  Limited foot exam with no ulcerations.  2+ distal pulses. ?Psychiatric:  grossly normal mood and affect, speech fluent and appropriate, AOx3 ?Neurologic:  CN 2-12 grossly intact, moves all extremities in coordinated fashion, sensation intact ? ? ?Radiological Exams on Admission: ?Independently reviewed - see discussion in A/P where applicable ? ?CT Soft Tissue Neck W Contrast ? ?Result Date: 08/20/2021 ?CLINICAL DATA:  Right-sided face and ear pain beginning a week ago with swelling. Difficulty swelling EXAM: CT NECK WITH CONTRAST TECHNIQUE: Multidetector CT imaging of the neck was performed using the standard protocol following the bolus administration of intravenous contrast. RADIATION DOSE REDUCTION: This exam was performed according to the departmental dose-optimization program which includes automated exposure control, adjustment of the mA and/or kV according to patient size and/or use of iterative reconstruction technique. CONTRAST:  OMNIPAQUE IOHEXOL 300 MG/ML  SOLN COMPARISON:  None Available. FINDINGS: Pharynx and larynx: Thickening of the tonsils with accentuated enhancement. The right palatine tonsil is asymmetrically thickened from a 16 mm low-density collection. Salivary glands: No inflammation, mass, or stone. Thyroid: Normal. Lymph nodes: Expected thickening of upper jugular chain lymph nodes. No nodal cavitation. Vascular: Negative Limited intracranial: Negative Visualized orbits: Negative Mastoids and visualized paranasal sinuses: Clear Skeleton: No acute or aggressive process. Upper chest: Negative. IMPRESSION: Tonsillitis with 16 mm right peritonsillar abscess. Electronically Signed   By: Tiburcio Pea M.D.   On: 08/20/2021 04:20   ? ?EKG: baseline in case of surgery  ? ? ?Labs on Admission: I have personally reviewed the available labs and imaging studies at the time of the  admission. ? ?Pertinent labs:   ?wbc: 14.8,  ?glucose 240,  ?+GAS,  ?lactic acid 1.1,  ?BC pending ? ?Assessment and Plan: ?Principal Problem: ?  Sepsis secondary to Group A strep pharyngitis and likely right peritonsillar abscess  ?Active Problems: ?  Uncontrolled type 2 diabetes mellitus with hyperglycemia, without long-term current use of insulin (HCC) ?  Essential hypertension ?  Hyperlipidemia ?  GERD (gastroesophageal reflux disease) ?  Snoring/obesity  ?  Tobacco abuse ?  Peritonsillar abscess ?  Group A streptococcal infection ?  ? ?Assessment and Plan: ?* Sepsis secondary to Group A strep pharyngitis and likely right peritonsillar abscess  ?41 year old male presenting with 8 day history of worsening throat pain and swelling found to be septic with elevated WBC and elevated heart rate secondary to group A strep pharyngitis and likely right peritonsillar abscess ?-admit to med surg ?-continue IV unasyn ?-ENT consulted who performed I&D of right tonsil, no pus and performed extensive debridement. Recommended he be admitted for IV abx and observation of airway/infection with swelling and high sugars. If doesn't improve, may need to go to OR.  ?-continue IV steroids, have added long acting and SSI  insulin for control of BS ?-pain control  ?-IVF ?-blood cultures pending, lactic acid wnl x 1 ? ? ?Uncontrolled type 2 diabetes mellitus with hyperglycemia, without long-term current use of insulin (HCC) ?No A1C in chart, check now ?Hold oral metformin/glipizide ?Start IVF, long acting insulin and SSI, especially given IV steroids  ?Diabetic education  ? ?Essential hypertension ?Appears he has untreated HTN, on no medication. Wife states they have never been told he has HTN.  ?With severe swelling of pharynx will hold on oral pills and give IV hydralazine prn  ? ? ?Hyperlipidemia ?Hold crestor with extensive pharyngeal edema ? ?GERD (gastroesophageal reflux disease) ?Continue protonix>IV ? ?Snoring/obesity  ?Suspicion  for OSA, needs sleep study outpatient  ? ?Tobacco abuse ?Nicotine patch, encouraged cessation  ? ? ? ?Advance Care Planning:   Code Status: Full Code  ? ?Consults: ENT: Dr. Elijah Birk  ? ?DVT Prophylaxis: SCDs  ? ?Family C

## 2021-08-20 NOTE — ED Triage Notes (Signed)
Patient arrives POV from home c/o right sided facial and right ear pain that started 1 week ago. Pt reports swelling has increased over the week and reports difficulty swallowing. ?

## 2021-08-20 NOTE — Assessment & Plan Note (Addendum)
Appears he has untreated HTN, on no medication. Wife states they have never been told he has HTN.  ?With severe swelling of pharynx will hold on oral pills and give IV hydralazine prn  ? ?

## 2021-08-20 NOTE — Assessment & Plan Note (Signed)
Hold crestor with extensive pharyngeal edema ?

## 2021-08-20 NOTE — Assessment & Plan Note (Addendum)
41 year old male presenting with 8 day history of worsening throat pain and swelling found to be septic with elevated WBC and elevated heart rate secondary to group A strep pharyngitis and likely right peritonsillar abscess ?-admit to med surg ?-continue IV unasyn ?-ENT consulted who performed I&D of right tonsil, no pus and performed extensive debridement. Recommended he be admitted for IV abx and observation of airway/infection with swelling and high sugars. If doesn't improve, may need to go to OR.  ?-continue IV steroids, have added long acting and SSI insulin for control of BS ?-pain control  ?-IVF ?-blood cultures pending, lactic acid wnl x 1 ? ?

## 2021-08-20 NOTE — H&P (Signed)
Subjective:  ?  ? Joshua Mcneil is a 41 y.o. male who presents for evaluation of right-sided throat pain for a week.  He has had no treatment for this.  He radiates to his right ear.  He does not feel well but is not septic. ? ?Patient History:  The following portions of the patient's history were reviewed and updated as appropriate: allergies, current medications, past family history, past medical history, past social history, past surgical history, and problem list. ? ?Review of Systems ?Pertinent items are noted in HPI.  ?  ?Objective:  ? ? BP (!) 157/104   Pulse 91   Temp 98.3 ?F (36.8 ?C) (Oral)   Resp (!) 21   Ht 5\' 7"  (1.702 m)   Wt 130.2 kg   SpO2 95%   BMI 44.95 kg/m?  ? ?Alert and oriented x3 ?I examined the patient with his wife present and was able to communicate in Spanish with him. ?Pupils equal round and reactive to light ?Right-sided palate swelling with uvular deviation ?Neck is tender without fluctuance or deviation of the trachea ?Nose without pus or septal deviation ?[place] normal without mastoid tenderness ?Cranial nerves II through XII intact ?Floor of mouth soft and not involved ?No dental pain on palpation of teeth ?Chest symmetric expansions bilaterally without use of accessory muscles ? ?CT scan - I reviewed the scan with the radiologist as well as the films myself.  It is possible there is a small right peritonsillar abscess measuring 1.6 cm.  There appears to be other low-density areas on the right side most notable at the angle of the mandible on the lingual surface which are suspicious for suppurative nodes.  Neither the radiologist nor myself see any evidence of free fluid in the neck or evidence of necrotizing fasciitis.  Is no free air in the neck. ? ?Assessment:  ? ?Acute pharyngitis with suspected right peritonsillar abscess ?Diabetes mellitus -uncontrolled ?Hypertension ?Obesity ?  ?Plan:  ? ?I anesthetized the right peritonsillar space and made a large incision along  the soft palate at the top of the tonsil.  Extensive debridement was carried out using a curved tonsil clamp and no pus was discovered.  Bleeding was self-limiting and minimal. ? ?At this point I would recommend admission to medicine given his poorly controlled diabetes and other medical issues.  I will continue to follow with you.  We will start him on IV antibiotics and low-dose steroids pain close attention to his blood sugars.  If his blood sugars cannot be controlled on the steroids, it is okay to stop them.  I will follow closely and may consider taking to the operating room if the patient does not improve as expected. ?

## 2021-08-20 NOTE — Assessment & Plan Note (Signed)
Suspicion for OSA, needs sleep study outpatient  ?

## 2021-08-20 NOTE — Progress Notes (Addendum)
I am aware of patient - he is stable.  Plan for I&D PTA although unsure of presence of pus based on CT scan. I am in the OR with emergency airway - will see shortly. ? ?Spoke with radiologist - he sees nothing of concern other than the right PTA.  No evidence radiographically of nec fasc. or odontogenic infection. ?

## 2021-08-21 ENCOUNTER — Other Ambulatory Visit (HOSPITAL_COMMUNITY): Payer: Self-pay

## 2021-08-21 DIAGNOSIS — I10 Essential (primary) hypertension: Secondary | ICD-10-CM

## 2021-08-21 DIAGNOSIS — E1165 Type 2 diabetes mellitus with hyperglycemia: Secondary | ICD-10-CM

## 2021-08-21 DIAGNOSIS — A400 Sepsis due to streptococcus, group A: Secondary | ICD-10-CM

## 2021-08-21 LAB — BASIC METABOLIC PANEL
Anion gap: 9 (ref 5–15)
BUN: 7 mg/dL (ref 6–20)
CO2: 22 mmol/L (ref 22–32)
Calcium: 9 mg/dL (ref 8.9–10.3)
Chloride: 103 mmol/L (ref 98–111)
Creatinine, Ser: 0.66 mg/dL (ref 0.61–1.24)
GFR, Estimated: >60 mL/min (ref >60)
Glucose, Bld: 253 mg/dL — ABNORMAL HIGH (ref 70–99)
Potassium: 4.1 mmol/L (ref 3.5–5.1)
Sodium: 134 mmol/L — ABNORMAL LOW (ref 135–145)

## 2021-08-21 LAB — CBC
HCT: 46.2 % (ref 39.0–52.0)
Hemoglobin: 15.9 g/dL (ref 13.0–17.0)
MCH: 31.4 pg (ref 26.0–34.0)
MCHC: 34.4 g/dL (ref 30.0–36.0)
MCV: 91.3 fL (ref 80.0–100.0)
Platelets: 266 10*3/uL (ref 150–400)
RBC: 5.06 MIL/uL (ref 4.22–5.81)
RDW: 11.9 % (ref 11.5–15.5)
WBC: 18.3 10*3/uL — ABNORMAL HIGH (ref 4.0–10.5)
nRBC: 0 % (ref 0.0–0.2)

## 2021-08-21 LAB — GLUCOSE, CAPILLARY
Glucose-Capillary: 270 mg/dL — ABNORMAL HIGH (ref 70–99)
Glucose-Capillary: 219 mg/dL — ABNORMAL HIGH (ref 70–99)

## 2021-08-21 MED ORDER — AMOXICILLIN-POT CLAVULANATE 875-125 MG PO TABS
1.0000 | ORAL_TABLET | Freq: Two times a day (BID) | ORAL | 0 refills | Status: AC
  Filled 2021-08-21: qty 14, 7d supply, fill #0

## 2021-08-21 MED ORDER — INSULIN GLARGINE-YFGN 100 UNIT/ML ~~LOC~~ SOLN
8.0000 [IU] | Freq: Two times a day (BID) | SUBCUTANEOUS | Status: DC
Start: 2021-08-21 — End: 2021-08-21
  Filled 2021-08-21 (×2): qty 0.08

## 2021-08-21 MED ORDER — NICOTINE 14 MG/24HR TD PT24: 14.0000 mg | MEDICATED_PATCH | Freq: Every day | TRANSDERMAL | 0 refills | Status: DC

## 2021-08-21 NOTE — Progress Notes (Signed)
Inpatient Diabetes Program Recommendations ? ?AACE/ADA: New Consensus Statement on Inpatient Glycemic Control  ? ?Target Ranges:  Prepandial:   less than 140 mg/dL ?     Peak postprandial:   less than 180 mg/dL (1-2 hours) ?     Critically ill patients:  140 - 180 mg/dL  ? ? Latest Reference Range & Units 08/20/21 17:14 08/20/21 18:27 08/20/21 21:34 08/21/21 07:35  ?Glucose-Capillary 70 - 99 mg/dL 242 (H) 683 (H) 419 (H) 270 (H)  ? ? Latest Reference Range & Units 08/20/21 15:21  ?Hemoglobin A1C 4.8 - 5.6 % 8.3 (H)  ? ?Review of Glycemic Control ? ?Diabetes history: DM2 ?Outpatient Diabetes medications: Glipizide XL 10 mg BID, Metformin 500 mg BID ?Current orders for Inpatient glycemic control: Semglee 8 units QHS, Novolog 0-15 units TID with meals; Decadron 8 mg Q6H ? ?Inpatient Diabetes Program Recommendations:   ? ?Insulin: If steroids are continued as ordered, please consider increasing Semglee to 13 units QHS (based on 130.2 kg x 0.1 units), adding Novolog 0-5 units QHS, and ordering Novolog 3 units TID with meals for meal coverage if patient eats at least 50% of meals. ? ?HbgA1C:  A1C 8.3% on 08/20/21 indicating an average glucose of 192 mg/dl over the past 2-3 months. ? ?NOTE: Diabetes coordinator working from [employer]. Spoke with patient over his cell phone (using [employer], [phone] Debarah Crape) about diabetes and home regimen for diabetes control. Patient reports being followed by PCP through [facility] and reports taking Metformin 500 mg BID and Glipizide 10 mg BID as an outpatient for diabetes control. Patient reports taking DM medications as prescribed but notes that he has not taken since being on antibiotics because he was unsure if he should still take them or not. Discussed taking DM medications as prescribed unless advised not to do so by his provider.  Patient reports that he last went to clinic on 08/01/21 and that he has an appointment for follow up in 3 months.   Patient reports that he is currently able to afford his medications. Patient states that he checks glucose daily and it has been 140-170 mg/dl over the past 2 weeks and prior to that it had been running higher.  Inquired about prior A1C and patient reports he does not know what an A1C is. Explained what an A1C is and discussed A1C results (8.3% on 08/20/21) and explained that current A1C indicates an average glucose of 192 mg/dl over the past 2-3 months. Discussed glucose and A1C goals. Encouraged patient to ask provider at clinic about A1C values in the future to help ensure his DM is being controlled. Discussed impact of sickness/infection on glucose control.  Informed patient that while inpatient he will be getting insulin for glycemic control and that he is currently ordered Decadron which is a steroid and contributing to hyperglycemia. Patient asked if he would be changed to insulin as outpatient and explained that he will most likely resume his oral DM medications at discharge and continue to follow up with clinic regarding DM management. Patient verbalized understanding of information discussed and reports no further questions at this time related to diabetes. ? ?Thanks, ?Orlando Penner, RN, MSN, CDE ?Diabetes Coordinator ?Inpatient Diabetes Program ?[phone] (Team Pager) ? ? ?

## 2021-08-21 NOTE — Progress Notes (Signed)
Nsg Discharge Note ? ?Admit Date:  08/20/2021 ?Discharge date: 08/21/2021 ?  ?Joshua Mcneil to be D/C'd Home per MD order.  AVS completed.   ?Removed Iv-CDI. Reviewed d/c paperwork with patient and wife. Answered all questions. Gave patient a doctors note and printed prescription for a nicotine patch. ?Stable patient and belongs wheeled to main entrance for d/c ? ? ? ? ?Discharge Medication: ?Allergies as of 08/21/2021   ?No Known Allergies ?  ? ?  ?Medication List  ?  ? ?STOP taking these medications   ? ?acetaminophen 500 MG tablet ?Commonly known as: TYLENOL ?  ?OVER THE COUNTER MEDICATION ?  ? ?  ? ?TAKE these medications   ? ?amoxicillin-clavulanate 875-125 MG tablet ?Commonly known as: Augmentin ?Take 1 tablet by mouth 2 (two) times daily for 7 days. ?  ?atorvastatin 10 MG tablet ?Commonly known as: LIPITOR ?Take 10 mg by mouth at bedtime. ?  ?glipiZIDE 10 MG 24 hr tablet ?Commonly known as: GLUCOTROL XL ?Take 20 mg by mouth every morning. ?  ?metFORMIN 500 MG tablet ?Commonly known as: GLUCOPHAGE ?Take 500 mg by mouth 2 (two) times daily. ?  ?nicotine 14 mg/24hr patch ?Commonly known as: NICODERM CQ - dosed in mg/24 hours ?Place 1 patch (14 mg total) onto the skin daily. ?Start taking on: Aug 22, 2021 ?  ?pantoprazole 40 MG tablet ?Commonly known as: PROTONIX ?Take 40 mg by mouth daily. ?  ? ?  ? ? ?Discharge Assessment: ?Vitals:  ? 08/21/21 0441 08/21/21 0738  ?BP: (!) 146/82 (!) 148/90  ?Pulse: 81 93  ?Resp: 16 16  ?Temp: (!) 97.5 ?F (36.4 ?C) (!) 97.4 ?F (36.3 ?C)  ?SpO2: 99% 99%  ? Skin clean, dry and intact without evidence of skin break down, no evidence of skin tears noted. ?IV catheter discontinued intact. Site without signs and symptoms of complications - no redness or edema noted at insertion site, patient denies c/o pain - only slight tenderness at site.  Dressing with slight pressure applied. ? ?D/c Instructions-Education: ?Discharge instructions given to patient/family with verbalized  understanding. ?D/c education completed with patient/family including follow up instructions, medication list, d/c activities limitations if indicated, with other d/c instructions as indicated by MD - patient able to verbalize understanding, all questions fully answered. ?Patient instructed to return to ED, call 911, or call MD for any changes in condition.  ?Patient escorted via WC, and D/C home via private auto. ? ?Karolee Ohs, RN ?08/21/2021 1:32 PM  ?

## 2021-08-21 NOTE — Discharge Summary (Signed)
Physician Discharge Summary  ?Patient ID: ?Joshua Mcneil ?MRN: [id number] ?DOB/AGE: May 14, 1980 41 y.o. ? ?Admit date: 08/20/2021 ?Discharge date: 08/21/2021 ? ?Admission Diagnoses: Right peritonsillar abscess ? ?Discharge Diagnoses:  ?Principal Problem: ?  Sepsis secondary to Group A strep pharyngitis and likely right peritonsillar abscess  ?Active Problems: ?  Uncontrolled type 2 diabetes mellitus with hyperglycemia, without long-term current use of insulin (HCC) ?  Essential hypertension ?  Peritonsillar abscess ?  Group A streptococcal infection ?  GERD (gastroesophageal reflux disease) ?  Hyperlipidemia ?  Snoring/obesity  ?  Tobacco abuse ? ? ?Discharged Condition: good ? ?Hospital Course: Patient's right peritonsillar abscess was drained.  He was maintained on steroids and antibiotics overnight.  He is doing much better.  His swelling is almost completely gone on exam this morning.  We will discharge him home on antibiotics. ? ?Treatments: Drainage of peritonsillar abscess ? ?Discharge Exam: ?Blood pressure (!) 148/90, pulse 93, temperature (!) 97.4 ?F (36.3 ?C), temperature source Oral, resp. rate 16, height 5\' 7"  (1.702 m), weight 130.2 kg, SpO2 99 %. ? ?Throat swelling improved ?Chest clear ?Cranial nerves intact ?Heart regular rate and rhythm ? ?Disposition: Discharge disposition: 01-Home or Self Care ? ? ? ? ? ? ? ?Allergies as of 08/21/2021   ?No Known Allergies ?  ? ?  ?Medication List  ?  ? ?STOP taking these medications   ? ?acetaminophen 500 MG tablet ?Commonly known as: TYLENOL ?  ?OVER THE COUNTER MEDICATION ?  ? ?  ? ?TAKE these medications   ? ?amoxicillin-clavulanate 875-125 MG tablet ?Commonly known as: Augmentin ?Take 1 tablet by mouth 2 (two) times daily for 7 days. ?  ?atorvastatin 10 MG tablet ?Commonly known as: LIPITOR ?Take 10 mg by mouth at bedtime. ?  ?glipiZIDE 10 MG 24 hr tablet ?Commonly known as: GLUCOTROL XL ?Take 20 mg by mouth every morning. ?  ?metFORMIN 500 MG  tablet ?Commonly known as: GLUCOPHAGE ?Take 500 mg by mouth 2 (two) times daily. ?  ?pantoprazole 40 MG tablet ?Commonly known as: PROTONIX ?Take 40 mg by mouth daily. ?  ? ?  ? ? Follow-up Information   ? ? 08/23/2021, NP Follow up.   ?Specialty: Nurse Practitioner ?Why: Friday Aug 31, 2021 at 10 am ?Contact information: ?[address] ?[address] ?[place] ?[phone] ? ? ?  ?  ? ?  ?  ? ?  ? ? ?Signed: ?[phone]. ?08/21/2021, 11:59 AM ? ? ?

## 2021-08-21 NOTE — Plan of Care (Signed)
?  Problem: Education: ?Goal: Knowledge of General Education information will improve ?Description: Including pain rating scale, medication(s)/side effects and non-pharmacologic comfort measures ?08/21/2021 1253 by Karolee Ohs, RN ?Outcome: Adequate for Discharge ?08/21/2021 1050 by Karolee Ohs, RN ?Outcome: Progressing ?  ?Problem: Health Behavior/Discharge Planning: ?Goal: Ability to manage health-related needs will improve ?08/21/2021 1253 by Karolee Ohs, RN ?Outcome: Adequate for Discharge ?08/21/2021 1050 by Karolee Ohs, RN ?Outcome: Progressing ?  ?Problem: Pain Managment: ?Goal: General experience of comfort will improve ?08/21/2021 1253 by Karolee Ohs, RN ?Outcome: Adequate for Discharge ?08/21/2021 1050 by Karolee Ohs, RN ?Outcome: Progressing ?  ?Problem: Safety: ?Goal: Ability to remain free from injury will improve ?08/21/2021 1253 by Karolee Ohs, RN ?Outcome: Adequate for Discharge ?08/21/2021 1050 by Karolee Ohs, RN ?Outcome: Progressing ?  ?Problem: Skin Integrity: ?Goal: Risk for impaired skin integrity will decrease ?Outcome: Adequate for Discharge ?  ?

## 2021-08-21 NOTE — TOC CM/SW Note (Signed)
Scheduled follow up appointment with Ricky Stabs at [facility] at [facility] on Aug 31, 2021 at 10 am. Information placed on AVS.  ? ? ?Changed pharmacy to [facility]. On discharge [facility] will call patient with cost of medications. If patient cannot afford TOC Team will see if patient qualifies for [facility] program ( patient can only receive MATCH once in a year).  Post discharge prescriptions can be filled at [facility]    ?

## 2021-08-21 NOTE — Progress Notes (Signed)
? ?[facility] ?PROGRESS NOTE ? ? ?Joshua Mcneil OBS:[id number] DOB: Aug 27, 1980 DOA: 08/20/2021 ? ?PCP: Patient, No Pcp Per (Inactive) ? ?Brief History/Interval Summary: 41 y.o. male with medical history significant of uncontrolled T2DM, HTN who presented to ED with 7 day history of throat pain and swelling.  He was found to be positive for group A streptococcus.  CT scan raise concern for right peritonsillar abscess.  Patient was seen by ENT who did bedside incision.  However no pus was noted.  Patient was started on Unasyn and dexamethasone.  Was hospitalized for further management.   ? ?Consultants: ENT ? ?Procedures: Bedside incision of area of concern around the right tonsil ? ? ? ?Subjective/Interval History: ?Patient mentions that he does not have any significant pain in the right neck and throat area.  Denies any shortness of breath.  No fever or chills. ? ? ? ?Assessment/Plan: ? ?Sepsis secondary to group A streptococcus pharyngitis with concern for right peritonsillar abscess ?Patient seen by ENT.  No abscess noted on bedside incision.  Patient placed on IV Unasyn.  Also started on dexamethasone.  He seems to be protecting his airway.  Continues to have swelling of the right neck and jaw area. ?Await further input from ENT today. ?Noted to be afebrile.  WBC 18.3 which could be from steroids.  HIV nonreactive.  Blood cultures are pending. ? ?Diabetes mellitus type 2, uncontrolled with hyperglycemia ?HbA1c 8.3. ?He is on metformin and glipizide at home which is currently on hold.  Started on SSI and glargine.  CBGs elevated likely due to steroids.  We will increase the dose of his glargine insulin. ? ?Essential hypertension ?Not noted to be on any medications prior to admission.  Pain could have contributed to elevated blood pressure readings.  Blood pressure still noted to be elevated.  Continue to monitor trends for now.  Steroids could be contributing to elevated readings as  well. ? ?Hyperlipidemia ?Holding Crestor ? ?GERD ?PPI ? ?Snoring/morbid obesity ?Estimated body mass index is 44.95 kg/m? as calculated from the following: ?  Height as of this encounter: 5\' 7"  (1.702 m). ?  Weight as of this encounter: 130.2 kg. ?Consider outpatient sleep study. ? ?Tobacco abuse ?Nicotine patch ?  ?DVT Prophylaxis: SCDs ?Code Status: Full code ?Family Communication: Discussed with patient ?Disposition Plan: Hopefully return home in improved ? ?Status is: Inpatient ?Remains inpatient appropriate because: IV antibiotics ? ? ? ? ? ?Medications: Scheduled: ? dexamethasone (DECADRON) injection  8 mg Intravenous Q6H  ? insulin aspart  0-15 Units Subcutaneous TID WC  ? insulin glargine-yfgn  8 Units Subcutaneous QHS  ? nicotine  14 mg Transdermal Daily  ? pantoprazole (PROTONIX) IV  40 mg Intravenous Q24H  ? ?Continuous: ? ampicillin-sulbactam (UNASYN) IV 3 g (08/21/21 0446)  ? ?08/23/21, morphine injection, ondansetron **OR** ondansetron (ZOFRAN) IV ? ?Antibiotics: ?Anti-infectives (From admission, onward)  ? ? Start     Dose/Rate Route Frequency Ordered Stop  ? 08/20/21 1700  Ampicillin-Sulbactam (UNASYN) 3 g in sodium chloride 0.9 % 100 mL IVPB       ? 3 g ?200 mL/hr over 30 Minutes Intravenous Every 6 hours 08/20/21 1539    ? 08/20/21 1000  Ampicillin-Sulbactam (UNASYN) 3 g in sodium chloride 0.9 % 100 mL IVPB  Status:  Discontinued       ? 3 g ?200 mL/hr over 30 Minutes Intravenous Every 6 hours 08/20/21 0924 08/20/21 1539  ? ?  ? ? ?Objective: ? ?Vital Signs ? ?Vitals:  ?  08/20/21 2150 08/20/21 2313 08/21/21 0441 08/21/21 0738  ?BP: (!) 147/80 132/89 (!) 146/82 (!) 148/90  ?Pulse: 71 98 81 93  ?Resp: 18 16 16 16   ?Temp: 98 ?F (36.7 ?C) 98.2 ?F (36.8 ?C) (!) 97.5 ?F (36.4 ?C) (!) 97.4 ?F (36.3 ?C)  ?TempSrc: Oral  Oral Oral  ?SpO2: 96% 96% 99% 99%  ?Weight:      ?Height:      ? ? ?Intake/Output Summary (Last 24 hours) at 08/21/2021 1131 ?Last data filed at 08/20/2021 1334 ?Gross per 24 hour   ?Intake 99.02 ml  ?Output --  ?Net 99.02 ml  ? ?Filed Weights  ? 08/20/21 0232  ?Weight: 130.2 kg  ? ? ?General appearance: Awake alert.  In no distress ?Right-sided facial swelling, neck swelling noted.  Warm to touch. ?Resp: Clear to auscultation bilaterally.  Normal effort ?Cardio: S1-S2 is normal regular.  No S3-S4.  No rubs murmurs or bruit ?GI: Abdomen is soft.  Nontender nondistended.  Bowel sounds are present normal.  No masses organomegaly ?Extremities: No edema.  Full range of motion of lower extremities. ?Neurologic: Alert and oriented x3.  No focal neurological deficits.  ? ? ?Lab Results: ? ?Data Reviewed: I have personally reviewed following labs and reports of the imaging studies ? ?CBC: ?Recent Labs  ?Lab 08/20/21 ?0249 08/21/21 ?0129  ?WBC 14.8* 18.3*  ?NEUTROABS 10.3*  --   ?HGB 16.0 15.9  ?HCT 45.6 46.2  ?MCV 89.9 91.3  ?PLT 258 266  ? ? ?Basic Metabolic Panel: ?Recent Labs  ?Lab 08/20/21 ?0249 08/21/21 ?0129  ?NA 136 134*  ?K 3.9 4.1  ?CL 104 103  ?CO2 22 22  ?GLUCOSE 240* 253*  ?BUN 7 7  ?CREATININE 0.75 0.66  ?CALCIUM 9.0 9.0  ? ? ?GFR: ?Estimated Creatinine Clearance: 159.2 mL/min (by C-G formula based on SCr of 0.66 mg/dL). ? ? ?HbA1C: ?Recent Labs  ?  08/20/21 ?1521  ?HGBA1C 8.3*  ? ? ?CBG: ?Recent Labs  ?Lab 08/20/21 ?1714 08/20/21 ?1827 08/20/21 ?2134 08/21/21 ?0735  ?GLUCAP 232* 226* 286* 270*  ? ? ? ?Recent Results (from the past 240 hour(s))  ?Group A Strep by PCR     Status: Abnormal  ? Collection Time: 08/20/21  3:32 AM  ? Specimen: Throat; Sterile Swab  ?Result Value Ref Range Status  ? Group A Strep by PCR DETECTED (A) NOT DETECTED Final  ?  Comment: Performed at [facility], [address] [place], [place]  ?Blood culture (routine x 2)     Status: None (Preliminary result)  ? Collection Time: 08/20/21 11:14 AM  ? Specimen: BLOOD  ?Result Value Ref Range Status  ? Specimen Description BLOOD RIGHT ANTECUBITAL  Final  ? Special Requests   Final  ?  BOTTLES DRAWN  AEROBIC AND ANAEROBIC Blood Culture adequate volume  ? Culture   Final  ?  NO GROWTH < 24 HOURS ?Performed at [facility], [address], [place], [place] ?  ? Report Status PENDING  Incomplete  ?  ? ? ?Radiology Studies: ?CT Soft Tissue Neck W Contrast ? ?Result Date: 08/20/2021 ?CLINICAL DATA:  Right-sided face and ear pain beginning a week ago with swelling. Difficulty swelling EXAM: CT NECK WITH CONTRAST TECHNIQUE: Multidetector CT imaging of the neck was performed using the standard protocol following the bolus administration of intravenous contrast. RADIATION DOSE REDUCTION: This exam was performed according to the departmental dose-optimization program which includes automated exposure control, adjustment of the  mA and/or kV according to patient size and/or use of iterative reconstruction technique. CONTRAST:  100mL OMNIPAQUE IOHEXOL 300 MG/ML  SOLN COMPARISON:  None Available. FINDINGS: Pharynx and larynx: Thickening of the tonsils with accentuated enhancement. The right palatine tonsil is asymmetrically thickened from a 16 mm low-density collection. Salivary glands: No inflammation, mass, or stone. Thyroid: Normal. Lymph nodes: Expected thickening of upper jugular chain lymph nodes. No nodal cavitation. Vascular: Negative Limited intracranial: Negative Visualized orbits: Negative Mastoids and visualized paranasal sinuses: Clear Skeleton: No acute or aggressive process. Upper chest: Negative. IMPRESSION: Tonsillitis with 16 mm right peritonsillar abscess. Electronically Signed   By: Tiburcio PeaJonathan  Watts M.D.   On: 08/20/2021 04:20   ? ? ? ? LOS: 1 day  ? ?Joshua Mcneil ? ?[facility] ?Pager on [email or web address] ? ?08/21/2021, 11:31 AM ? ? ?

## 2021-08-21 NOTE — Plan of Care (Signed)

## 2021-08-22 ENCOUNTER — Other Ambulatory Visit (HOSPITAL_COMMUNITY): Payer: Self-pay

## 2021-08-25 LAB — CULTURE, BLOOD (ROUTINE X 2)
Culture: NO GROWTH
Special Requests: ADEQUATE

## 2021-08-25 NOTE — Progress Notes (Signed)
Subjective:    Joshua Mcneil - 41 y.o. male MRN [id number]  Date of birth: 01/26/1981  HPI  Joshua ClauseJose Carlos Dufrane is to establish care and hospital discharge follow-up. He is accompanied by his significant other.   Current issues and/or concerns: Hospital discharge follow-[facility]: [facility]20[facility]2021 [facility]: Admission Diagnoses: Right peritonsillar abscess   Discharge Diagnoses:  Principal Problem:   Sepsis secondary to Group A strep pharyngitis and likely right peritonsillar abscess  Active Problems:   Uncontrolled type 2 diabetes mellitus with hyperglycemia, without long-term current use of insulin (HCC)   Essential hypertension   Peritonsillar abscess   Group A streptococcal infection   GERD (gastroesophageal reflux disease)   Hyperlipidemia   Snoring/obesity    Tobacco abuse     Discharged Condition: good   Hospital Course: Patient's right peritonsillar abscess was drained.  He was maintained on steroids and antibiotics overnight.  He is doing much better.  His swelling is almost completely gone on exam this morning.  We will discharge him home on antibiotics.   Treatments: Drainage of peritonsillar abscess   Today's visit: Peritonsillar abscess follow-up: Reports two days after hospital discharge developed right neck discomfort near where he reports most recent abscess was. Reports discomfort has been the same since that time. Discomfort made worse when he presses the site. Reports he is concerned because recent abscess began with symptoms similar to what he is feeling currently. Also, wants to know if the incision drainage on the inside of neck has healed. He is eating and drinking as normal. Denies red flag symptoms such as but not limited to chest pain, shortness of breath, cough, and neck swelling. Reports he did complete course of Augmentin.   2. Diabetes type 2 follow-up: Last A1C:  8.3% on 08/20/2021 Med Adherence:  [x]  Yes    []   No Medication side effects:  []  Yes    [x]  No Home Monitoring?  [x]  Yes    []  No Home glucose results range: 224 this morning  Diet Adherence: []  Yes    [x]  No Exercise: []  Yes    [x]  No Reports in the past higher doses of Metformin caused nausea.   3. Blood pressure check: Reports has never been diagnosed or prescribed medications for blood pressure.   4. Hyperlipidemia follow-up: Doing well on Atorvastatin, no issues/concerns.   5. GERD follow-up: Doing well on Protonix, no issues/concerns.  6. Snoring follow-up: Endorses snoring.   7. Tobacco abuse follow-up: Reports did not pickup nicotine patch since hospital discharge. Smoking about 3 cigarettes daily.    ROS per HPI    Health Maintenance:  Health Maintenance Due  Topic Date Due   COVID-19 Vaccine (1) Never done   FOOT EXAM  Never done   OPHTHALMOLOGY EXAM  Never done   URINE MICROALBUMIN  Never done   Hepatitis C Screening  Never done   TETANUS/TDAP  Never done     Past Medical History: Patient Active Problem List   Diagnosis Date Noted   Uncontrolled type 2 diabetes mellitus with hyperglycemia, without long-term current use of insulin (HCC) 08/20/2021   Essential hypertension 08/20/2021   Peritonsillar abscess 08/20/2021   Sepsis secondary to Group A strep pharyngitis and likely right peritonsillar abscess  08/20/2021   Group A streptococcal infection 08/20/2021   GERD (gastroesophageal reflux disease) 08/20/2021   Hyperlipidemia 08/20/2021   Snoring/obesity  08/20/2021   Tobacco abuse 08/20/2021     Social History   reports  that he has been smoking cigarettes. He has been smoking an average of .5 packs per day. He has been exposed to tobacco smoke. He has never used smokeless tobacco. He reports current alcohol use of about 12.0 standard drinks per week. He reports that he does not use drugs.   Family History  family history is not on file.   Medications: reviewed and updated   Objective:    Physical Exam BP 116/83 (BP Location: Left Arm, Patient Position: Sitting, Cuff Size: Large)   Pulse 88   Temp 98.3 F (36.8 C)   Resp 18   Ht 5' 11.06" (1.805 m)   Wt 283 lb 12.8 oz (128.7 kg)   SpO2 94%   BMI 39.51 kg/m   Physical Exam HENT:     Head: Normocephalic and atraumatic.     Nose: Nose normal.     Mouth/Throat:     Mouth: Mucous membranes are moist.     Pharynx: Oropharynx is clear.  Eyes:     Extraocular Movements: Extraocular movements intact.     Conjunctiva/sclera: Conjunctivae normal.     Pupils: Pupils are equal, round, and reactive to light.  Cardiovascular:     Rate and Rhythm: Normal rate and regular rhythm.     Pulses: Normal pulses.     Heart sounds: Normal heart sounds.  Pulmonary:     Effort: Pulmonary effort is normal.     Breath sounds: Normal breath sounds. No stridor.  Musculoskeletal:     Cervical back: Normal range of motion and neck supple.  Neurological:     General: No focal deficit present.     Mental Status: He is alert and oriented to person, place, and time.  Psychiatric:        Mood and Affect: Mood normal.        Behavior: Behavior normal.    Assessment & Plan:  1. Encounter to establish care: - Patient presents today to establish care.  - Return for annual physical examination, labs, and health maintenance. Arrive fasting meaning having no food for at least 8 hours prior to appointment. You may have only water or black coffee. Please take scheduled medications as normal.  2. Hospital discharge follow-up: - Reviewed hospital course, current medications, ensured proper follow-up in place, and addressed concerns.   3. Peritonsillar abscess: - Counseled patient to report to the emergency department for concerns of recurring peritonsillar abscess. Patient verbalized understanding.  - Referral to ENT for further evaluation and management. - Ambulatory referral to ENT  4. Uncontrolled type 2 diabetes mellitus with hyperglycemia,  without long-term current use of insulin (HCC): - Hemoglobin A1c not at goal at 8.3% on 08/20/2021, goal < 7%. - Continue Glipizide as prescribed.  - Change Metformin to Metformin XR with goal of decreasing gastrointestinal side effects.  - Metformin to Metformin XR dosage increased from 500 mg twice daily to 1000 mg twice daily.  - Discussed the importance of healthy eating habits, low-carbohydrate diet, low-sugar diet, regular aerobic exercise (at least 150 minutes a week as tolerated) and medication compliance to achieve or maintain control of diabetes. - To achieve an A1C goal of less than or equal to 7.0 percent, a fasting blood sugar of 80 to 130 mg/dL and a postprandial glucose (90 to 120 minutes after a meal) less than 180 mg/dL. In the event of sugars less than 60 mg/dl or greater than 532 mg/dl please notify the clinic ASAP. It is recommended that you undergo annual eye exams  and annual foot exams. - Follow-up with primary provider in 4 weeks or sooner if needed. Will recheck hemoglobin A1c at that time.  - metFORMIN (GLUCOPHAGE-XR) 500 MG 24 hr tablet; Take 2 tablets (1,000 mg total) by mouth 2 (two) times daily with a meal.  Dispense: 120 tablet; Refill: 0 - glipiZIDE (GLUCOTROL XL) 10 MG 24 hr tablet; Take 2 tablets (20 mg total) by mouth every morning.  Dispense: 60 tablet; Refill: 2  5. Blood pressure check: - Normal today in office.   6. Hyperlipidemia, unspecified hyperlipidemia type: - Practice low-fat heart healthy diet and at least 150 minutes of moderate intensity exercise weekly as tolerated.  - Continue Atorvastatin as prescribed.  - Follow-up with primary provider as scheduled.  - atorvastatin (LIPITOR) 10 MG tablet; Take 1 tablet (10 mg total) by mouth at bedtime.  Dispense: 90 tablet; Refill: 1  7. Gastroesophageal reflux disease, unspecified whether esophagitis present: - Continue Pantoprazole as prescribed.  - Follow-up with primary provider as scheduled.  -  pantoprazole (PROTONIX) 40 MG tablet; Take 1 tablet (40 mg total) by mouth daily.  Dispense: 90 tablet; Refill: 1  8. Snoring: - Sleep study for further evaluation.  - [facility] Sleep Study; Future  9. Tobacco abuse: - Nicotine patch as prescribed. Counseled on medication adherence and adverse effects.  - Follow-up with primary provider in 4 weeks or sooner if needed.  - nicotine (NICODERM CQ - DOSED IN MG/24 HOURS) 14 mg/24hr patch; Place 1 patch (14 mg total) onto the skin daily.  Dispense: 28 patch; Refill: 0  10. Language barrier: - Photographer. Name:Yashira  ID#: [id number]    Patient was given clear instructions to go to Emergency Department or return to medical center if symptoms don't improve, worsen, or new problems develop.The patient verbalized understanding.  I discussed the assessment and treatment plan with the patient. The patient was provided an opportunity to ask questions and all were answered. The patient agreed with the plan and demonstrated an understanding of the instructions.   The patient was advised to call back or seek an in-person evaluation if the symptoms worsen or if the condition fails to improve as anticipated.    Ricky Stabs, NP 08/31/2021, 12:23 PM Primary Care at [facility]

## 2021-08-31 ENCOUNTER — Ambulatory Visit (INDEPENDENT_AMBULATORY_CARE_PROVIDER_SITE_OTHER): Payer: Self-pay | Admitting: Family

## 2021-08-31 ENCOUNTER — Encounter: Payer: Self-pay | Admitting: Family

## 2021-08-31 VITALS — BP 116/83 | HR 88 | Temp 98.3°F | Resp 18 | Ht 71.06 in | Wt 283.8 lb

## 2021-08-31 DIAGNOSIS — R0683 Snoring: Secondary | ICD-10-CM

## 2021-08-31 DIAGNOSIS — Z09 Encounter for follow-up examination after completed treatment for conditions other than malignant neoplasm: Secondary | ICD-10-CM

## 2021-08-31 DIAGNOSIS — I10 Essential (primary) hypertension: Secondary | ICD-10-CM

## 2021-08-31 DIAGNOSIS — Z0130 Encounter for examination of blood pressure without abnormal findings: Secondary | ICD-10-CM

## 2021-08-31 DIAGNOSIS — E785 Hyperlipidemia, unspecified: Secondary | ICD-10-CM

## 2021-08-31 DIAGNOSIS — Z789 Other specified health status: Secondary | ICD-10-CM

## 2021-08-31 DIAGNOSIS — E1165 Type 2 diabetes mellitus with hyperglycemia: Secondary | ICD-10-CM

## 2021-08-31 DIAGNOSIS — F17210 Nicotine dependence, cigarettes, uncomplicated: Secondary | ICD-10-CM

## 2021-08-31 DIAGNOSIS — Z7689 Persons encountering health services in other specified circumstances: Secondary | ICD-10-CM

## 2021-08-31 DIAGNOSIS — J36 Peritonsillar abscess: Secondary | ICD-10-CM

## 2021-08-31 DIAGNOSIS — K219 Gastro-esophageal reflux disease without esophagitis: Secondary | ICD-10-CM

## 2021-08-31 DIAGNOSIS — Z720 Tobacco use: Secondary | ICD-10-CM

## 2021-08-31 MED ORDER — GLIPIZIDE ER 10 MG PO TB24: 20.0000 mg | ORAL_TABLET | Freq: Every morning | ORAL | 2 refills | Status: DC

## 2021-08-31 MED ORDER — NICOTINE 14 MG/24HR TD PT24: 14.0000 mg | MEDICATED_PATCH | Freq: Every day | TRANSDERMAL | 0 refills | Status: AC

## 2021-08-31 MED ORDER — PANTOPRAZOLE SODIUM 40 MG PO TBEC: 40.0000 mg | DELAYED_RELEASE_TABLET | Freq: Every day | ORAL | 1 refills | Status: AC

## 2021-08-31 MED ORDER — METFORMIN HCL ER 500 MG PO TB24: 1000.0000 mg | ORAL_TABLET | Freq: Two times a day (BID) | ORAL | 0 refills | Status: DC

## 2021-08-31 MED ORDER — ATORVASTATIN CALCIUM 10 MG PO TABS: 10.0000 mg | ORAL_TABLET | Freq: Every day | ORAL | 1 refills | Status: AC

## 2021-08-31 NOTE — Patient Instructions (Signed)
Thank you for choosing Primary Care at [facility] for your medical home!    Deborah [name] Mcquitty was seen by Rema Fendt, NP today.   Elliot Cousin Halfmann's primary care provider is Ricky Stabs, NP.   For the best care possible,  you should try to see Ricky Stabs, NP whenever you come to office.   We look forward to seeing you again soon!  If you have any questions about your visit today,  please call us at [phone]  Or feel free to reach your provider via MyChart.   Keeping you healthy   Get these tests Blood pressure- Have your blood pressure checked once a year by your healthcare provider.  Normal blood pressure is 120/80. Weight- Have your body mass index (BMI) calculated to screen for obesity.  BMI is a measure of body fat based on height and weight. You can also calculate your own BMI at https://www.west-esparza.com/. Cholesterol- Have your cholesterol checked regularly starting at age 41, sooner may be necessary if you have diabetes, high blood pressure, if a family member developed heart diseases at an early age or if you smoke.  Chlamydia, HIV, and other sexual transmitted disease- Get screened each year until the age of 48 then within three months of each new sexual partner. Diabetes- Have your blood sugar checked regularly if you have high blood pressure, high cholesterol, a family history of diabetes or if you are overweight.   Get these vaccines Flu shot- Every fall. Tetanus shot- Every 10 years. Menactra- Single dose; prevents meningitis.   Take these steps Don't smoke- If you do smoke, ask your healthcare provider about quitting. For tips on how to quit, go to www.smokefree.gov or call 1-800-QUIT-NOW. Be physically active- Exercise 5 days a week for at least 30 minutes.  If you are not already physically active start slow and gradually work up to 30 minutes of moderate physical activity.  Examples of moderate activity include walking briskly, mowing the yard,  dancing, swimming bicycling, etc. Eat a healthy diet- Eat a variety of healthy foods such as fruits, vegetables, low fat milk, low fat cheese, yogurt, lean meats, poultry, fish, beans, tofu, etc.  For more information on healthy eating, go to www.thenutritionsource.org Drink alcohol in moderation- Limit alcohol intake two drinks or less a day.  Never drink and drive. Dentist- Brush and floss teeth twice daily; visit your dentis twice a year. Depression-Your emotional health is as important as your physical health.  If you're feeling down, losing interest in things you normally enjoy please talk with your healthcare provider. Gun Safety- If you keep a gun in your home, keep it unloaded and with the safety lock on.  Bullets should be stored separately. Helmet use- Always wear a helmet when riding a motorcycle, bicycle, rollerblading or skateboarding. Safe sex- If you may be exposed to a sexually transmitted infection, use a condom Seat belts- Seat bels can save your life; always wear one. Smoke/Carbon Monoxide detectors- These detectors need to be installed on the appropriate level of your home.  Replace batteries at least once a year. Skin Cancer- When out in the sun, cover up and use sunscreen SPF 15 or higher. Violence- If anyone is threatening or hurting you, please tell your healthcare provider.

## 2021-08-31 NOTE — Progress Notes (Signed)
Pt presents to establish care and hospital f/u  Pt states still feeling pain in throat after discharge  Needs refill on Metformin and Glipizide

## 2021-09-17 NOTE — Progress Notes (Signed)
Patient ID: Joshua Mcneil, male    DOB: 10-Oct-1980  MRN: [id number]  CC: Annual Physical Exam  Subjective: Joshua Mcneil is a 41 y.o. male who presents for annual physical exam.   His concerns today include:  Diabetes type 2 follow-up: 08/31/2021: - Hemoglobin A1c not at goal at 8.3% on 08/20/2021, goal < 7%. - Continue Glipizide as prescribed.  - Change Metformin to Metformin XR with goal of decreasing gastrointestinal side effects.  - Metformin to Metformin XR dosage increased from 500 mg twice daily to 1000 mg twice daily.   09/25/2021: Doing well on current regimen, no issues/concerns. Home blood sugars 130's - 220's. He is monitoring what he eats and exercising.   2. Belly button: Reports feels a bump. Not causing significant discomfort. Today's exam unremarkable. We discussed for patient to monitor and if worsens will update recommendations at that time. Patient agreeable.   Patient Active Problem List   Diagnosis Date Noted   Uncontrolled type 2 diabetes mellitus with hyperglycemia, without long-term current use of insulin (HCC) 08/20/2021   Essential hypertension 08/20/2021   Peritonsillar abscess 08/20/2021   Sepsis secondary to Group A strep pharyngitis and likely right peritonsillar abscess  08/20/2021   Group A streptococcal infection 08/20/2021   GERD (gastroesophageal reflux disease) 08/20/2021   Hyperlipidemia 08/20/2021   Snoring/obesity  08/20/2021   Tobacco abuse 08/20/2021     Current Outpatient Medications on File Prior to Visit  Medication Sig Dispense Refill   atorvastatin (LIPITOR) 10 MG tablet Take 1 tablet (10 mg total) by mouth at bedtime. 90 tablet 1   glipiZIDE (GLUCOTROL XL) 10 MG 24 hr tablet Take 2 tablets (20 mg total) by mouth every morning. 60 tablet 2   metFORMIN (GLUCOPHAGE-XR) 500 MG 24 hr tablet Take 2 tablets (1,000 mg total) by mouth 2 (two) times daily with a meal. 120 tablet 0   nicotine (NICODERM CQ - DOSED IN MG/24 HOURS) 14  mg/24hr patch Place 1 patch (14 mg total) onto the skin daily. 28 patch 0   pantoprazole (PROTONIX) 40 MG tablet Take 1 tablet (40 mg total) by mouth daily. 90 tablet 1   No current facility-administered medications on file prior to visit.    No Known Allergies  Social History   Socioeconomic History   Marital status: Single    Spouse name: Not on file   Number of children: Not on file   Years of education: Not on file   Highest education level: Not on file  Occupational History   Not on file  Tobacco Use   Smoking status: Every Day    Packs/day: 0.50    Types: Cigarettes    Passive exposure: Current   Smokeless tobacco: Never  Vaping Use   Vaping Use: Never used  Substance and Sexual Activity   Alcohol use: Yes    Alcohol/week: 12.0 standard drinks of alcohol    Types: 12 Cans of beer per week    Comment: pt states he does not drink a 12 pack every week   Drug use: No   Sexual activity: Not on file  Other Topics Concern   Not on file  Social History Narrative   Not on file   Social Determinants of Health   Financial Resource Strain: Not on file  Food Insecurity: Not on file  Transportation Needs: Not on file  Physical Activity: Not on file  Stress: Not on file  Social Connections: Not on file  Intimate Partner Violence: Not  on file    No family history on file.  No past surgical history on file.  ROS: Review of Systems Negative except as stated above  PHYSICAL EXAM: BP 133/88 (BP Location: Left Arm, Patient Position: Sitting, Cuff Size: Large)   Pulse 93   Temp 98.3 F (36.8 C)   Resp 16   Ht 5' 11.06" (1.805 m)   Wt 290 lb (131.5 kg)   SpO2 94%   BMI 40.38 kg/m   Physical Exam HENT:     Head: Normocephalic and atraumatic.     Right Ear: Tympanic membrane, ear canal and external ear normal.     Left Ear: Tympanic membrane, ear canal and external ear normal.     Nose: Nose normal.     Mouth/Throat:     Mouth: Mucous membranes are moist.      Pharynx: Oropharynx is clear.  Eyes:     Extraocular Movements: Extraocular movements intact.     Conjunctiva/sclera: Conjunctivae normal.     Pupils: Pupils are equal, round, and reactive to light.  Cardiovascular:     Rate and Rhythm: Normal rate and regular rhythm.     Pulses: Normal pulses.     Heart sounds: Normal heart sounds.  Pulmonary:     Effort: Pulmonary effort is normal.     Breath sounds: Normal breath sounds.  Abdominal:     General: Bowel sounds are normal.     Palpations: Abdomen is soft.  Genitourinary:    Comments: Patient declined.  Musculoskeletal:        General: Normal range of motion.     Right shoulder: Normal.     Left shoulder: Normal.     Right upper arm: Normal.     Left upper arm: Normal.     Right elbow: Normal.     Left elbow: Normal.     Right forearm: Normal.     Left forearm: Normal.     Right wrist: Normal.     Left wrist: Normal.     Right hand: Normal.     Left hand: Normal.     Cervical back: Normal, normal range of motion and neck supple.     Thoracic back: Normal.     Lumbar back: Normal.     Right hip: Normal.     Left hip: Normal.     Right upper leg: Normal.     Left upper leg: Normal.     Right knee: Normal.     Left knee: Normal.     Right lower leg: Normal.     Left lower leg: Normal.     Right ankle: Normal.     Left ankle: Normal.     Right foot: Normal.     Left foot: Normal.  Skin:    General: Skin is warm and dry.     Capillary Refill: Capillary refill takes less than 2 seconds.  Neurological:     General: No focal deficit present.     Mental Status: He is alert and oriented to person, place, and time.  Psychiatric:        Mood and Affect: Mood normal.        Behavior: Behavior normal.    Diabetic Foot Exam - Simple   Simple Foot Form Visual Inspection No deformities, no ulcerations, no other skin breakdown bilaterally: Yes Sensation Testing Intact to touch and monofilament testing bilaterally:  Yes Pulse Check Posterior Tibialis and Dorsalis pulse intact bilaterally: Yes Comments  Results for orders placed or performed in visit on 09/25/21  POCT glycosylated hemoglobin (Hb A1C)  Result Value Ref Range   Hemoglobin A1C 9.0 (A) 4.0 - 5.6 %   HbA1c POC (<> result, manual entry)     HbA1c, POC (prediabetic range)     HbA1c, POC (controlled diabetic range)       ASSESSMENT AND PLAN: 1. Annual physical exam - Counseled on 150 minutes of exercise per week as tolerated, healthy eating (including decreased daily intake of saturated fats, cholesterol, added sugars, sodium), STI prevention, and routine healthcare maintenance.  2. Screening for metabolic disorder - Screening liver function.  - Hepatic Function Panel  3. Thyroid disorder screen - TSH to check thyroid function.   - TSH  4. Leukocytosis, unspecified type - Update white blood cell count. - WBC  5. Need for hepatitis C screening test - Hepatitis C antibody to screen for hepatitis C.  - Hepatitis C Antibody  6. Uncontrolled type 2 diabetes mellitus with hyperglycemia, without long-term current use of insulin (HCC) - Hemoglobin A1c today not at goal at 9.0%, goal < 7%. This is increased from previous 8.3% on 08/20/2021. - Continue Metformin and Glipizide as prescribed. No refills needed as of present.  - Begin Dapagliflozin Propanediol as prescribed. Counseled on medication adherence and adverse effects.  - Screening microalbumin creatinine urine ratio. - Discussed the importance of healthy eating habits, low-carbohydrate diet, low-sugar diet, regular aerobic exercise (at least 150 minutes a week as tolerated) and medication compliance to achieve or maintain control of diabetes. - Referral to Endocrinology for further evaluation and management.  - Follow-up with primary provider in 4 weeks or sooner if needed.  - Microalbumin / creatinine urine ratio - POCT glycosylated hemoglobin (Hb A1C) - Ambulatory referral  to Endocrinology - dapagliflozin propanediol (FARXIGA) 5 MG TABS tablet; Take 1 tablet (5 mg total) by mouth daily before breakfast.  Dispense: 30 tablet; Refill: 2  7. Diabetic eye exam [facility](HCC) - Referral to Ophthalmology for diabetic eye exam.  - Ambulatory referral to Ophthalmology  8. Encounter for diabetic foot exam (HCC) - Completed today in office.   9. Hyperlipidemia, unspecified hyperlipidemia type - Lipid panel to screen for high cholesterol.  - Lipid panel  10. Language barrier - [employer]. Name:Raul  ID#: [id number]     Patient was given the opportunity to ask questions.  Patient verbalized understanding of the plan and was able to repeat key elements of the plan. Patient was given clear instructions to go to Emergency Department or return to medical center if symptoms don't improve, worsen, or new problems develop.The patient verbalized understanding.   Orders Placed This Encounter  Procedures   Microalbumin / creatinine urine ratio   Hepatitis C Antibody   WBC   Hepatic Function Panel   Lipid panel   TSH   Ambulatory referral to Ophthalmology   Ambulatory referral to Endocrinology   POCT glycosylated hemoglobin (Hb A1C)     Requested Prescriptions   Signed Prescriptions Disp Refills   dapagliflozin propanediol (FARXIGA) 5 MG TABS tablet 30 tablet 2    Sig: Take 1 tablet (5 mg total) by mouth daily before breakfast.    Return in about 1 year (around 09/26/2022) for Physical per patient preference.  Rema FendtAmy J Channelle Bottger, NP

## 2021-09-25 ENCOUNTER — Encounter: Payer: Self-pay | Admitting: Family

## 2021-09-25 ENCOUNTER — Ambulatory Visit (INDEPENDENT_AMBULATORY_CARE_PROVIDER_SITE_OTHER): Payer: Self-pay | Admitting: Family

## 2021-09-25 ENCOUNTER — Ambulatory Visit: Payer: Self-pay | Admitting: *Deleted

## 2021-09-25 VITALS — BP 133/88 | HR 93 | Temp 98.3°F | Resp 16 | Ht 71.06 in | Wt 290.0 lb

## 2021-09-25 DIAGNOSIS — E1165 Type 2 diabetes mellitus with hyperglycemia: Secondary | ICD-10-CM

## 2021-09-25 DIAGNOSIS — Z1159 Encounter for screening for other viral diseases: Secondary | ICD-10-CM

## 2021-09-25 DIAGNOSIS — Z1329 Encounter for screening for other suspected endocrine disorder: Secondary | ICD-10-CM

## 2021-09-25 DIAGNOSIS — Z789 Other specified health status: Secondary | ICD-10-CM

## 2021-09-25 DIAGNOSIS — Z0001 Encounter for general adult medical examination with abnormal findings: Secondary | ICD-10-CM

## 2021-09-25 DIAGNOSIS — Z13228 Encounter for screening for other metabolic disorders: Secondary | ICD-10-CM

## 2021-09-25 DIAGNOSIS — E119 Type 2 diabetes mellitus without complications: Secondary | ICD-10-CM

## 2021-09-25 DIAGNOSIS — E785 Hyperlipidemia, unspecified: Secondary | ICD-10-CM

## 2021-09-25 DIAGNOSIS — Z0000 Encounter for general adult medical examination without abnormal findings: Secondary | ICD-10-CM

## 2021-09-25 DIAGNOSIS — D72829 Elevated white blood cell count, unspecified: Secondary | ICD-10-CM

## 2021-09-25 LAB — POCT GLYCOSYLATED HEMOGLOBIN (HGB A1C): Hemoglobin A1C: 9 % — AB (ref 4.0–5.6)

## 2021-09-25 MED ORDER — DAPAGLIFLOZIN PROPANEDIOL 5 MG PO TABS: 5.0000 mg | ORAL_TABLET | Freq: Every day | ORAL | 2 refills | Status: DC

## 2021-09-25 NOTE — Progress Notes (Signed)
Pt presents for annual physical exam, pt states that he has bump on belly button denies any pain but feels pressure in area

## 2021-09-25 NOTE — Telephone Encounter (Signed)
Summary: Discuss medication   Patient's spouse requesting a cb to discuss what dapagliflozin propanediol (FARXIGA) 5 MG TABS tablet is used for, spouse also inquiring if there is an alternative medication that will be more affordable      Chief Complaint: called to see what Farixga Symptoms: na Frequency: na Pertinent Negatives: Patient denies na Disposition: [] ED /[] Urgent Care (no appt availability in office) / [] Appointment(In office/virtual)/ []  [facility]/ [] Home Care/ [] Refused Recommended Disposition /[]  Mobile Bus/ [x]  Follow-up with PCP Additional Notes: Farxiga cost [phone]. Pt's wife called to ask If there is a different medication or a program that would cover the cost. She will check with pharm later today. She also asked if he was to stop the other two diabetes meds, she was instructed to continue both.  Answer Assessment - Initial Assessment Questions 1. NAME of MEDICATION: "What medicine are you calling about?"     Farixa 2. QUESTION: "What is your question?" (e.g., double dose of medicine, side effect)     What it is for 3. PRESCRIBING HCP: "Who prescribed it?" Reason: if prescribed by specialist, call should be referred to that group.     Amy Stephens 4. SYMPTOMS: "Do you have any symptoms?"     no 5. SEVERITY: If symptoms are present, ask "Are they mild, moderate or severe?"     na 6. PREGNANCY:  "Is there any chance that you are pregnant?" "When was your last menstrual period?"     na  Protocols used: Medication Question Call-A-AH

## 2021-09-25 NOTE — Patient Instructions (Signed)
Cuidados preventivos en los hombres de 41 a 64 aos de edad Preventive Care 65-41 Years Old, Male Los cuidados preventivos hacen referencia a las opciones en cuanto al estilo de vida y a las visitas al mdico, las cuales pueden promover la salud y Counsellor. Las visitas de cuidado preventivo tambin se denominan exmenes de Health visitor. Qu puedo esperar para mi visita de cuidado preventivo? Asesoramiento Durante la visita de cuidado preventivo, el mdico puede preguntarle sobre lo siguiente: Antecedentes mdicos, incluidos los siguientes: Problemas mdicos pasados. Antecedentes mdicos familiares. Salud actual, incluido lo siguiente: Su bienestar emocional. Training and development officer y las relaciones personales. Su actividad sexual. Estilo de vida, incluido lo siguiente: Consumo de alcohol, nicotina, tabaco o drogas. Acceso a armas de fuego. Hbitos de alimentacin, ejercicio y sueo. Cuestiones de seguridad, como el uso de cinturn de seguridad y casco de Scientist, research (physical sciences). Uso de pantalla solar. Su trabajo y [place] laboral. Examen fsico El mdico revisar lo siguiente: Diplomatic Services operational officer y [place]. Estos pueden usarse para calcular el IMC (ndice de masa corporal). El [facility] es una medicin que indica si tiene un peso saludable. Circunferencia de la cintura. Es Neomia Dear medicin alrededor de Lobbyist. Esta medicin tambin indica si tiene un peso saludable y puede ayudar a predecir su riesgo de padecer ciertas enfermedades, como diabetes tipo 2 y presin arterial alta. Frecuencia cardaca y presin arterial. Temperatura corporal. Piel para detectar manchas anormales. Qu vacunas necesito?  Las vacunas se aplican a varias edades, segn un cronograma. El [employer] recomendar vacunas segn su edad, sus antecedentes mdicos, su estilo de vida y [address], como los viajes o el lugar donde trabaja. Qu pruebas necesito? Pruebas de deteccin El mdico puede recomendar pruebas de deteccin de ciertas  afecciones. Esto puede incluir: Niveles de lpidos y colesterol. Pruebas de deteccin de la diabetes. Esto se Physiological scientist un control del azcar en la sangre (glucosa) despus de no haber comido durante un periodo de tiempo (ayuno). Prueba de hepatitis B. Prueba de hepatitis C. Prueba del VIH (virus de inmunodeficiencia humana). Pruebas de infecciones de transmisin sexual (ITS), si est en riesgo. Pruebas de deteccin de cncer de pulmn. Examen de deteccin del cncer de prstata. Pruebas de deteccin de Building services engineer. Hable con su mdico sobre los [employer], las opciones de tratamiento y, si corresponde, la necesidad de Education officer, environmental ms pruebas. Siga estas instrucciones en su casa: Comida y bebida  Siga una dieta que incluya frutas y verduras frescas, cereales integrales, protenas magras y productos lcteos descremados. Tome los suplementos vitamnicos y [employer] se lo haya indicado el mdico. No beba alcohol si el mdico se lo prohbe. Si bebe alcohol: Limite la cantidad que consume de 0 a 2 medidas por da. Sepa cunta cantidad de alcohol hay en las bebidas que toma. En los [address], una medida equivale a una botella de cerveza de 12 oz (355 ml), un vaso de vino de 5 oz (148 ml) o un vaso de una bebida alcohlica de alta graduacin de 1 oz (44 ml). Estilo de [employer] dientes a la maana y a la noche con Conservator, museum/gallery con fluoruro. Use hilo dental una vez al da. Haga al menos 30 minutos de ejercicio, 5 o ms [address]. No consuma ningn producto que contenga nicotina o tabaco. Estos productos incluyen cigarrillos, tabaco para Theatre manager y aparatos de vapeo, como los Administrator, Civil Service. Si necesita ayuda para dejar de fumar, consulte al mdico. No consuma drogas. Si es sexualmente [place],  practique sexo seguro. Use un condn u otra forma de proteccin para prevenir las infecciones de transmisin sexual (ITS). Tome aspirina nicamente  como se lo haya indicado el mdico. Asegrese de que comprende qu cantidad y cul presentacin debe tomar. Trabaje con el mdico para averiguar si es seguro y beneficioso para usted tomar aspirina a diario. Busque maneras saludables de Charity fundraiser, tales como: Meditacin, yoga o Optometrist. Lleve un diario personal. Hable con una persona confiable. Pase tiempo con amigos y familiares. Minimice la exposicin a la radiacin UV para reducir el riesgo de cncer de piel. Seguridad [place] siempre el cinturn de seguridad al conducir o viajar en un vehculo. No conduzca: Si ha estado bebiendo alcohol. No viaje con un conductor que ha estado bebiendo. Si est cansado o distrado. Mientras est enviando mensajes de texto. Si ha estado usando sustancias o drogas que alteran la funcin mental. Use un casco y otros equipos de proteccin durante las actividades deportivas. Si tiene armas de fuego en su casa, asegrese de seguir todos los procedimientos de seguridad correspondientes. Cundo volver? Acuda al mdico una vez al ao para una visita anual de control de bienestar. Pregntele al mdico con qu frecuencia debe realizarse un control de la vista y los dientes. Mantenga su esquema de vacunacin al da. Esta informacin no tiene Theme park manager el consejo del mdico. Asegrese de hacerle al mdico cualquier pregunta que tenga. Document Revised: 10/12/2020 Document Reviewed: 10/12/2020 Elsevier Patient Education  2023 [employer].

## 2021-09-26 LAB — HEPATITIS C ANTIBODY: Hep C Virus Ab: NONREACTIVE

## 2021-09-26 LAB — HEPATIC FUNCTION PANEL
ALT: 28 IU/L (ref 0–44)
AST: 18 IU/L (ref 0–40)
Albumin: 3.7 g/dL — ABNORMAL LOW (ref 4.0–5.0)
Alkaline Phosphatase: 69 IU/L (ref 44–121)
Bilirubin Total: 0.3 mg/dL (ref 0.0–1.2)
Bilirubin, Direct: <0.1 mg/dL (ref 0.00–0.40)
Total Protein: 6.6 g/dL (ref 6.0–8.5)

## 2021-09-26 LAB — LIPID PANEL
Chol/HDL Ratio: 4.7 ratio (ref 0.0–5.0)
Cholesterol, Total: 154 mg/dL (ref 100–199)
HDL: 33 mg/dL — ABNORMAL LOW (ref >39)
LDL Chol Calc (NIH): 96 mg/dL (ref 0–99)
Triglycerides: 143 mg/dL (ref 0–149)
VLDL Cholesterol Cal: 25 mg/dL (ref 5–40)

## 2021-09-26 LAB — MICROALBUMIN / CREATININE URINE RATIO
Creatinine, Urine: 188.8 mg/dL
Microalb/Creat Ratio: 10 mg/g creat (ref 0–29)
Microalbumin, Urine: 18.3 ug/mL

## 2021-09-26 LAB — WBC: WBC: 9.9 10*3/uL (ref 3.4–10.8)

## 2021-09-26 LAB — TSH: TSH: 1.93 u[IU]/mL (ref 0.450–4.500)

## 2021-09-28 ENCOUNTER — Other Ambulatory Visit: Payer: Self-pay

## 2021-09-28 ENCOUNTER — Telehealth: Payer: Self-pay

## 2021-10-12 ENCOUNTER — Other Ambulatory Visit: Payer: Self-pay

## 2021-10-15 ENCOUNTER — Other Ambulatory Visit: Payer: Self-pay

## 2021-10-16 NOTE — Progress Notes (Signed)
Patient ID: Joshua Mcneil, male    DOB: 06/15/1980  MRN: [id number]  CC: Diabetes Follow-Up  Subjective: Joshua Mcneil is a 41 y.o. male who presents for diabetes follow-up.  His concerns today include:  - Doing well on diabetes medication without issues/concerns. Home blood sugars 170's-200's.  - Penis foreskin irritation and cracking skin for 6 months. This is recurrent. Treated in the past with medication and helped. Symptoms usually last 1 week and then resolve. Noticed this time symptoms have lasted for 2 weeks. States "I have urine leaking." Reports in the past told this is because he eats too much salt.    Patient Active Problem List   Diagnosis Date Noted   Uncontrolled type 2 diabetes mellitus with hyperglycemia, without long-term current use of insulin (HCC) 08/20/2021   Essential hypertension 08/20/2021   Peritonsillar abscess 08/20/2021   Sepsis secondary to Group A strep pharyngitis and likely right peritonsillar abscess  08/20/2021   Group A streptococcal infection 08/20/2021   GERD (gastroesophageal reflux disease) 08/20/2021   Hyperlipidemia 08/20/2021   Snoring/obesity  08/20/2021   Tobacco abuse 08/20/2021     Current Outpatient Medications on File Prior to Visit  Medication Sig Dispense Refill   atorvastatin (LIPITOR) 10 MG tablet Take 1 tablet (10 mg total) by mouth at bedtime. 90 tablet 1   glipiZIDE (GLUCOTROL XL) 10 MG 24 hr tablet Take 2 tablets (20 mg total) by mouth every morning. 60 tablet 2   nicotine (NICODERM CQ - DOSED IN MG/24 HOURS) 14 mg/24hr patch Place 1 patch (14 mg total) onto the skin daily. 28 patch 0   pantoprazole (PROTONIX) 40 MG tablet Take 1 tablet (40 mg total) by mouth daily. 90 tablet 1   No current facility-administered medications on file prior to visit.    No Known Allergies  Social History   Socioeconomic History   Marital status: Single    Spouse name: Not on file   Number of children: Not on file   Years of  education: Not on file   Highest education level: Not on file  Occupational History   Not on file  Tobacco Use   Smoking status: Every Day    Packs/day: 0.50    Types: Cigarettes    Passive exposure: Current   Smokeless tobacco: Never  Vaping Use   Vaping Use: Never used  Substance and Sexual Activity   Alcohol use: Yes    Alcohol/week: 12.0 standard drinks of alcohol    Types: 12 Cans of beer per week    Comment: pt states he does not drink a 12 pack every week   Drug use: No   Sexual activity: Not on file  Other Topics Concern   Not on file  Social History Narrative   Not on file   Social Determinants of Health   Financial Resource Strain: Not on file  Food Insecurity: Not on file  Transportation Needs: Not on file  Physical Activity: Not on file  Stress: Not on file  Social Connections: Not on file  Intimate Partner Violence: Not on file    No family history on file.  No past surgical history on file.  ROS: Review of Systems Negative except as stated above  PHYSICAL EXAM: BP 123/86 (BP Location: Left Arm, Patient Position: Sitting, Cuff Size: Large)   Pulse (!) 109   Temp 98.3 F (36.8 C)   Resp 16   Ht 5' 11.06" (1.805 m)   Wt 287 lb (130.2 kg)  SpO2 93%   BMI 39.96 kg/m    Physical Exam HENT:     Head: Normocephalic and atraumatic.  Eyes:     Extraocular Movements: Extraocular movements intact.     Conjunctiva/sclera: Conjunctivae normal.     Pupils: Pupils are equal, round, and reactive to light.  Cardiovascular:     Rate and Rhythm: Tachycardia present.     Pulses: Normal pulses.     Heart sounds: Normal heart sounds.  Pulmonary:     Effort: Pulmonary effort is normal.     Breath sounds: Normal breath sounds.  Genitourinary:    Penis: Uncircumcised. Tenderness present.      Testes: Normal.     Comments: Glans penis cracking skin without discharge/bleeding. Margorie Mcneil, CMA present.  Musculoskeletal:     Cervical back: Normal range  of motion and neck supple.  Neurological:     General: No focal deficit present.     Mental Status: He is alert and oriented to person, place, and time.  Psychiatric:        Mood and Affect: Mood normal.        Behavior: Behavior normal.     Results for orders placed or performed in visit on 10/25/21  POCT glycosylated hemoglobin (Hb A1C)  Result Value Ref Range   Hemoglobin A1C 8.7 (A) 4.0 - 5.6 %   HbA1c POC (<> result, manual entry)     HbA1c, POC (prediabetic range)     HbA1c, POC (controlled diabetic range)       ASSESSMENT AND PLAN: 1. Uncontrolled type 2 diabetes mellitus with hyperglycemia, without long-term current use of insulin (HCC) - Hemoglobin A1c not at goal at 8.7%, goal < 7%. However, this is improved from previous 9.0% on 09/25/2021. - Continue Metformin as prescribed.  - Increase Dapagliflozin Propanediol from 5 mg daily to 10 mg daily.  - Discussed the importance of healthy eating habits, low-carbohydrate diet, low-sugar diet, regular aerobic exercise (at least 150 minutes a week as tolerated) and medication compliance to achieve or maintain control of diabetes. - Follow-up in 4 weeks or sooner if needed.  - POCT glycosylated hemoglobin (Hb A1C) - dapagliflozin propanediol (FARXIGA) 10 MG TABS tablet; Take 1 tablet (10 mg total) by mouth daily before breakfast.  Dispense: 30 tablet; Refill: 3 - metFORMIN (GLUCOPHAGE-XR) 500 MG 24 hr tablet; Take 2 tablets (1,000 mg total) by mouth 2 (two) times daily with a meal.  Dispense: 120 tablet; Refill: 3  2. Foreskin problem 3. Urinary incontinence, unspecified type - Referral to Urology for further evaluation and management. - Ambulatory referral to Urology  4. Routine screening for STI (sexually transmitted infection) - Screening today. - Urine cytology ancillary only - HSV(herpes simplex vrs) 1+2 ab-IgG  5. Language barrier - Photographer. Name: [name]  ID#:  [id number]   Patient was given the opportunity  to ask questions.  Patient verbalized understanding of the plan and was able to repeat key elements of the plan. Patient was given clear instructions to go to Emergency Department or return to medical center if symptoms don't improve, worsen, or new problems develop.The patient verbalized understanding.   Orders Placed This Encounter  Procedures   HSV(herpes simplex vrs) 1+2 ab-IgG   Ambulatory referral to Urology   POCT glycosylated hemoglobin (Hb A1C)     Requested Prescriptions   Signed Prescriptions Disp Refills   dapagliflozin propanediol (FARXIGA) 10 MG TABS tablet 30 tablet 3    Sig: Take 1 tablet (10 mg total) by  mouth daily before breakfast.   metFORMIN (GLUCOPHAGE-XR) 500 MG 24 hr tablet 120 tablet 3    Sig: Take 2 tablets (1,000 mg total) by mouth 2 (two) times daily with a meal.    Return in about 4 weeks (around 11/22/2021) for Follow-Up or next available DM .  Rema Fendt, NP

## 2021-10-25 ENCOUNTER — Ambulatory Visit (INDEPENDENT_AMBULATORY_CARE_PROVIDER_SITE_OTHER): Payer: Self-pay | Admitting: Family

## 2021-10-25 ENCOUNTER — Other Ambulatory Visit (HOSPITAL_COMMUNITY)
Admission: RE | Admit: 2021-10-25 | Discharge: 2021-10-25 | Disposition: A | Discharge status: Home / Self Care | Payer: Self-pay | Source: Ambulatory Visit | Attending: Family | Admitting: Family

## 2021-10-25 VITALS — BP 123/86 | HR 109 | Temp 98.3°F | Resp 16 | Ht 71.06 in | Wt 287.0 lb

## 2021-10-25 DIAGNOSIS — Z113 Encounter for screening for infections with a predominantly sexual mode of transmission: Secondary | ICD-10-CM | POA: Insufficient documentation

## 2021-10-25 DIAGNOSIS — R32 Unspecified urinary incontinence: Secondary | ICD-10-CM

## 2021-10-25 DIAGNOSIS — E1165 Type 2 diabetes mellitus with hyperglycemia: Secondary | ICD-10-CM

## 2021-10-25 DIAGNOSIS — Z789 Other specified health status: Secondary | ICD-10-CM

## 2021-10-25 DIAGNOSIS — N478 Other disorders of prepuce: Secondary | ICD-10-CM

## 2021-10-25 LAB — POCT GLYCOSYLATED HEMOGLOBIN (HGB A1C): Hemoglobin A1C: 8.7 % — AB (ref 4.0–5.6)

## 2021-10-25 MED ORDER — METFORMIN HCL ER 500 MG PO TB24: 1000.0000 mg | ORAL_TABLET | Freq: Two times a day (BID) | ORAL | 3 refills | Status: DC

## 2021-10-25 MED ORDER — DAPAGLIFLOZIN PROPANEDIOL 10 MG PO TABS: 10.0000 mg | ORAL_TABLET | Freq: Every day | ORAL | 3 refills | Status: AC

## 2021-10-25 NOTE — Progress Notes (Signed)
Pt presents for diabetes f/u, pt states that he is having irritation around the foreskin of his penis states that since he has been diagnosed with diabetes it has been irritated

## 2021-10-26 ENCOUNTER — Other Ambulatory Visit: Payer: Self-pay | Admitting: Family

## 2021-10-26 DIAGNOSIS — A6000 Herpesviral infection of urogenital system, unspecified: Secondary | ICD-10-CM | POA: Insufficient documentation

## 2021-10-26 LAB — URINE CYTOLOGY ANCILLARY ONLY
Bacterial Vaginitis-Urine: NEGATIVE
Candida Urine: NEGATIVE
Chlamydia: NEGATIVE
Comment: NEGATIVE
Comment: NEGATIVE
Comment: NORMAL
Neisseria Gonorrhea: NEGATIVE
Trichomonas: NEGATIVE

## 2021-10-26 LAB — HSV(HERPES SIMPLEX VRS) I + II AB-IGG
HSV 1 Glycoprotein G Ab, IgG: 28.9 index — ABNORMAL HIGH (ref 0.00–0.90)
HSV 2 IgG, Type Spec: <0.91 index (ref 0.00–0.90)

## 2021-10-26 MED ORDER — VALACYCLOVIR HCL 1 G PO TABS
1000.0000 mg | ORAL_TABLET | Freq: Two times a day (BID) | ORAL | 2 refills | Status: AC
Start: 2021-10-26 — End: ?

## 2021-11-12 NOTE — Progress Notes (Deleted)
Patient ID: Joshua Mcneil, male    DOB: 08-05-1980  MRN: [id number]  CC: Diabetes Follow-Up  Subjective: Joshua Mcneil is a 41 y.o. male who presents for diabetes type 2 follow-up.   His concerns today include:  - Hemoglobin A1c not at goal at 8.7%, goal < 7%. However, this is improved from previous 9.0% on 09/25/2021. - Continue Metformin as prescribed.  - Increase Dapagliflozin Propanediol from 5 mg daily to 10 mg daily.   Patient Active Problem List   Diagnosis Date Noted   Herpes genitalis 10/26/2021   Uncontrolled type 2 diabetes mellitus with hyperglycemia, without long-term current use of insulin (HCC) 08/20/2021   Essential hypertension 08/20/2021   Peritonsillar abscess 08/20/2021   Sepsis secondary to Group A strep pharyngitis and likely right peritonsillar abscess  08/20/2021   Group A streptococcal infection 08/20/2021   GERD (gastroesophageal reflux disease) 08/20/2021   Hyperlipidemia 08/20/2021   Snoring/obesity  08/20/2021   Tobacco abuse 08/20/2021     Current Outpatient Medications on File Prior to Visit  Medication Sig Dispense Refill   atorvastatin (LIPITOR) 10 MG tablet Take 1 tablet (10 mg total) by mouth at bedtime. 90 tablet 1   dapagliflozin propanediol (FARXIGA) 10 MG TABS tablet Take 1 tablet (10 mg total) by mouth daily before breakfast. 30 tablet 3   glipiZIDE (GLUCOTROL XL) 10 MG 24 hr tablet Take 2 tablets (20 mg total) by mouth every morning. 60 tablet 2   metFORMIN (GLUCOPHAGE-XR) 500 MG 24 hr tablet Take 2 tablets (1,000 mg total) by mouth 2 (two) times daily with a meal. 120 tablet 3   nicotine (NICODERM CQ - DOSED IN MG/24 HOURS) 14 mg/24hr patch Place 1 patch (14 mg total) onto the skin daily. 28 patch 0   pantoprazole (PROTONIX) 40 MG tablet Take 1 tablet (40 mg total) by mouth daily. 90 tablet 1   valACYclovir (VALTREX) 1000 MG tablet Take 1 tablet (1,000 mg total) by mouth 2 (two) times daily. 20 tablet 2   No current  facility-administered medications on file prior to visit.    No Known Allergies  Social History   Socioeconomic History   Marital status: Single    Spouse name: Not on file   Number of children: Not on file   Years of education: Not on file   Highest education level: Not on file  Occupational History   Not on file  Tobacco Use   Smoking status: Every Day    Packs/day: 0.50    Types: Cigarettes    Passive exposure: Current   Smokeless tobacco: Never  Vaping Use   Vaping Use: Never used  Substance and Sexual Activity   Alcohol use: Yes    Alcohol/week: 12.0 standard drinks of alcohol    Types: 12 Cans of beer per week    Comment: pt states he does not drink a 12 pack every week   Drug use: No   Sexual activity: Not on file  Other Topics Concern   Not on file  Social History Narrative   Not on file   Social Determinants of Health   Financial Resource Strain: Not on file  Food Insecurity: Not on file  Transportation Needs: Not on file  Physical Activity: Not on file  Stress: Not on file  Social Connections: Not on file  Intimate Partner Violence: Not on file    No family history on file.  No past surgical history on file.  ROS: Review of Systems Negative except  as stated above  PHYSICAL EXAM: There were no vitals taken for this visit.  Physical Exam  {male adult master:310786} {male adult master:310785}     Latest Ref Rng & Units 09/25/2021    3:29 PM 08/21/2021    1:29 AM 08/20/2021    2:49 AM  CMP  Glucose 70 - 99 mg/dL  762  831   BUN 6 - 20 mg/dL  7  7   Creatinine 5.17 - 1.24 mg/dL  6.16  0.73   Sodium 710 - 145 mmol/L  134  136   Potassium 3.5 - 5.1 mmol/L  4.1  3.9   Chloride 98 - 111 mmol/L  103  104   CO2 22 - 32 mmol/L  22  22   Calcium 8.9 - 10.3 mg/dL  9.0  9.0   Total Protein 6.0 - 8.5 g/dL 6.6     Total Bilirubin 0.0 - 1.2 mg/dL 0.3     Alkaline Phos 44 - 121 IU/L 69     AST 0 - 40 IU/L 18     ALT 0 - 44 IU/L 28      Lipid  Panel     Component Value Date/Time   CHOL 154 09/25/2021 1529   TRIG 143 09/25/2021 1529   HDL 33 (L) 09/25/2021 1529   CHOLHDL 4.7 09/25/2021 1529   LDLCALC 96 09/25/2021 1529    CBC    Component Value Date/Time   WBC 9.9 09/25/2021 1529   WBC 18.3 (H) 08/21/2021 0129   RBC 5.06 08/21/2021 0129   HGB 15.9 08/21/2021 0129   HCT 46.2 08/21/2021 0129   PLT 266 08/21/2021 0129   MCV 91.3 08/21/2021 0129   MCH 31.4 08/21/2021 0129   MCHC 34.4 08/21/2021 0129   RDW 11.9 08/21/2021 0129   LYMPHSABS 2.7 08/20/2021 0249   MONOABS 1.3 (H) 08/20/2021 0249   EOSABS 0.4 08/20/2021 0249   BASOSABS 0.1 08/20/2021 0249    ASSESSMENT AND PLAN:  There are no diagnoses linked to this encounter.   Patient was given the opportunity to ask questions.  Patient verbalized understanding of the plan and was able to repeat key elements of the plan. Patient was given clear instructions to go to Emergency Department or return to medical center if symptoms don't improve, worsen, or new problems develop.The patient verbalized understanding.   No orders of the defined types were placed in this encounter.    Requested Prescriptions    No prescriptions requested or ordered in this encounter    No follow-ups on file.  Rema Fendt, NP

## 2021-11-20 NOTE — Progress Notes (Signed)
Erroneous encounter-disregard

## 2021-11-21 ENCOUNTER — Ambulatory Visit: Payer: No Typology Code available for payment source | Admitting: Family

## 2021-11-21 DIAGNOSIS — Z789 Other specified health status: Secondary | ICD-10-CM

## 2021-11-21 DIAGNOSIS — E1165 Type 2 diabetes mellitus with hyperglycemia: Secondary | ICD-10-CM

## 2021-11-28 ENCOUNTER — Encounter: Payer: No Typology Code available for payment source | Admitting: Family

## 2021-11-28 DIAGNOSIS — E1165 Type 2 diabetes mellitus with hyperglycemia: Secondary | ICD-10-CM

## 2021-11-28 DIAGNOSIS — Z789 Other specified health status: Secondary | ICD-10-CM

## 2021-12-17 ENCOUNTER — Other Ambulatory Visit: Payer: Self-pay

## 2021-12-19 ENCOUNTER — Other Ambulatory Visit: Payer: Self-pay

## 2021-12-25 ENCOUNTER — Other Ambulatory Visit: Payer: Self-pay

## 2022-01-08 ENCOUNTER — Other Ambulatory Visit: Payer: Self-pay

## 2022-01-10 ENCOUNTER — Other Ambulatory Visit: Payer: Self-pay

## 2022-01-23 ENCOUNTER — Other Ambulatory Visit: Payer: Self-pay

## 2022-04-10 ENCOUNTER — Other Ambulatory Visit: Payer: Self-pay | Admitting: Family

## 2022-04-10 DIAGNOSIS — R748 Abnormal levels of other serum enzymes: Secondary | ICD-10-CM

## 2022-04-10 DIAGNOSIS — R1084 Generalized abdominal pain: Secondary | ICD-10-CM

## 2022-05-14 ENCOUNTER — Other Ambulatory Visit: Payer: No Typology Code available for payment source

## 2022-06-05 ENCOUNTER — Ambulatory Visit
Admission: RE | Admit: 2022-06-05 | Discharge: 2022-06-05 | Disposition: A | Discharge status: Home / Self Care | Payer: No Typology Code available for payment source | Source: Ambulatory Visit | Attending: Family | Admitting: Family

## 2022-06-05 DIAGNOSIS — R748 Abnormal levels of other serum enzymes: Secondary | ICD-10-CM

## 2022-06-05 DIAGNOSIS — R1084 Generalized abdominal pain: Secondary | ICD-10-CM

## 2022-06-18 ENCOUNTER — Ambulatory Visit (INDEPENDENT_AMBULATORY_CARE_PROVIDER_SITE_OTHER): Payer: No Typology Code available for payment source | Admitting: Internal Medicine

## 2022-06-18 ENCOUNTER — Encounter: Payer: Self-pay | Admitting: Internal Medicine

## 2022-06-18 VITALS — BP 142/90 | HR 84 | Ht 71.0 in | Wt 285.0 lb

## 2022-06-18 DIAGNOSIS — K760 Fatty (change of) liver, not elsewhere classified: Secondary | ICD-10-CM

## 2022-06-18 DIAGNOSIS — E1165 Type 2 diabetes mellitus with hyperglycemia: Secondary | ICD-10-CM

## 2022-06-18 DIAGNOSIS — N4889 Other specified disorders of penis: Secondary | ICD-10-CM

## 2022-06-18 LAB — POCT GLYCOSYLATED HEMOGLOBIN (HGB A1C): Hemoglobin A1C: 8 % — AB (ref 4.0–5.6)

## 2022-06-18 LAB — POCT GLUCOSE (DEVICE FOR HOME USE): POC Glucose: 344 mg/dl — AB (ref 70–99)

## 2022-06-18 MED ORDER — GLIPIZIDE 10 MG PO TABS: 10.0000 mg | ORAL_TABLET | Freq: Two times a day (BID) | ORAL | 3 refills | Status: AC

## 2022-06-18 MED ORDER — METFORMIN HCL ER 500 MG PO TB24: 1000.0000 mg | ORAL_TABLET | Freq: Two times a day (BID) | ORAL | 3 refills | Status: AC

## 2022-06-18 NOTE — Patient Instructions (Signed)
-   Restart  Metformin 1 tablet daily with Breakfast for 1 week, then increase to 1 tablet with Breakfast and 1 tablet with Supper for  1 week, then increase to 2 tablets with Breakfast  and 1 tablet with Supper for another 1 week , then finally 2 Tablets with Breakfast and 2 tablets with Supper.    - Restart Glipizide 10 mg , 1 tablet before Breakfast and 1 tablet before Supper    HOW TO TREAT LOW BLOOD SUGARS (Blood sugar LESS THAN 70 MG/DL) Please follow the RULE OF 15 for the treatment of hypoglycemia treatment (when your (blood sugars are less than 70 mg/dL)   STEP 1: Take 15 grams of carbohydrates when your blood sugar is low, which includes:  3-4 GLUCOSE TABS  OR 3-4 OZ OF JUICE OR REGULAR SODA OR ONE TUBE OF GLUCOSE GEL    STEP 2: RECHECK blood sugar in 15 MINUTES STEP 3: If your blood sugar is still low at the 15 minute recheck --> then, go back to STEP 1 and treat AGAIN with another 15 grams of carbohydrates.

## 2022-06-18 NOTE — Progress Notes (Signed)
Name: Joshua Mcneil  Age/ Sex: 42 y.o., male   MRN/ DOB: [id number], October 12, 1980     PCP: Camillia Herter, NP   Reason for Endocrinology Evaluation: Type 2 Diabetes Mellitus  Initial Endocrine Consultative Visit: 06/18/2022    PATIENT IDENTIFIER: Joshua Mcneil is a 42 y.o. male with a past medical history of DM, HTN, dyslipidemia. The patient has followed with Endocrinology clinic since 06/18/2022 for consultative assistance with management of his diabetes.  DIABETIC HISTORY:  Joshua Mcneil was diagnosed with DM 2023, he has not been on insulin in the past. His hemoglobin A1c has ranged from 8.3% in 08/2021, peaking at 9.0% in 2023.   SUBJECTIVE:    Today (06/18/2022): Joshua Mcneil is here to establish care for diabetes management.  He checks his blood sugars 1 times daily. The patient has not had hypoglycemic episodes since the last clinic visit.  Pt has not been taking his glycemic agents for the past 2 weeks due to " good glucose " with fasting 140-160 mg/dL   He also discontinued his medication due to his concerns regarding kidney damage and hepatic injury Per patient, he has a pending evaluation per GI, he had a recent ultrasound  His main concern today is penile irritation that he has been suffering with for the past year, the patient has seen his PCP for this but it has not resolved and this seems to be his main concern today.  HOME DIABETES REGIMEN:  Metformin 500 mg XR 2 tabs twice daily -not taking Glipizide XL 10 mg 1 tabs BID- not taking     Statin: Yes ACE-I/ARB: No    METER DOWNLOAD SUMMARY: n/a  DIABETIC COMPLICATIONS: Microvascular complications:    Denies: CKD, neuropathy Last Eye Exam:   Macrovascular complications:   Denies: CAD, CVA, PVD   HISTORY:  Past Medical History:  Past Medical History:  Diagnosis Date  . Diabetes mellitus without complication ([place])   . Hypertension    Past Surgical History: No past surgical history  on file. Social History:  reports that he has been smoking cigarettes. He has been smoking an average of .5 packs per day. He has been exposed to tobacco smoke. He has never used smokeless tobacco. He reports current alcohol use of about 12.0 standard drinks of alcohol per week. He reports that he does not use drugs. Family History: No family history on file.   HOME MEDICATIONS: Allergies as of 06/18/2022   No Known Allergies      Medication List        Accurate as of June 18, 2022  7:42 AM. If you have any questions, ask your nurse or doctor.          atorvastatin 10 MG tablet Commonly known as: LIPITOR Take 1 tablet (10 mg total) by mouth at bedtime.   glipiZIDE 10 MG 24 hr tablet Commonly known as: GLUCOTROL XL Take 2 tablets (20 mg total) by mouth every morning.   metFORMIN 500 MG 24 hr tablet Commonly known as: GLUCOPHAGE-XR Take 2 tablets (1,000 mg total) by mouth 2 (two) times daily with a meal.   nicotine 14 mg/24hr patch Commonly known as: NICODERM CQ - dosed in mg/24 hours Place 1 patch (14 mg total) onto the skin daily.   pantoprazole 40 MG tablet Commonly known as: PROTONIX Take 1 tablet (40 mg total) by mouth daily.   valACYclovir 1000 MG tablet Commonly known as: VALTREX Take 1 tablet (1,000 mg total) by mouth 2 (  two) times daily.         OBJECTIVE:   Vital Signs: There were no vitals taken for this visit.  Wt Readings from Last 3 Encounters:  10/25/21 287 lb (130.2 kg)  09/25/21 290 lb (131.5 kg)  08/31/21 283 lb 12.8 oz (128.7 kg)     Exam: General: Pt appears well and is in NAD  Lungs: Clear with good BS bilat   Heart: RRR   Abdomen:  soft, nontender  Extremities: No pretibial edema.   Neuro: MS is good with appropriate affect, pt is alert and Ox3    DM foot exam: 06/18/2022  The skin of the feet is intact without sores or ulcerations. The pedal pulses are 2+ on right and 2+ on left. The sensation is intact to a screening 5.07,  10 gram monofilament bilaterally    DATA REVIEWED:  Lab Results  Component Value Date   HGBA1C 8.7 (A) 10/25/2021   HGBA1C 9.0 (A) 09/25/2021   HGBA1C 8.3 (H) 08/20/2021   Lab Results  Component Value Date   LDLCALC 96 09/25/2021   CREATININE 0.66 08/21/2021   Lab Results  Component Value Date   MICRALBCREAT 10 09/25/2021     Lab Results  Component Value Date   CHOL 154 09/25/2021   HDL 33 (L) 09/25/2021   LDLCALC 96 09/25/2021   TRIG 143 09/25/2021   CHOLHDL 4.7 09/25/2021       In office BG 344 mg/dL   ASSESSMENT / PLAN / RECOMMENDATIONS:   1) Type 2 Diabetes Mellitus, Poorly controlled, Without complications - Most recent A1c of 8.0 %. Goal A1c < 7.0 %.    -I have discussed with the patient the pathophysiology of diabetes. We went over the natural progression of the disease. We stressed the importance of lifestyle changes. I explained the complications associated with diabetes including retinopathy, nephropathy, neuropathy as well as increased risk of cardiovascular disease. We went over the benefit seen with glycemic control.   -I explained to the patient that diabetic patients are at higher than normal risk for amputations.  -Patient with low health literacy, unfortunately he blames hepatic steatosis and penile irritation on his medications -Patient does not like the fact that he is on too many medications -I did explain to the patient the importance of optimizing glucose control and preventing microvascular complications, I did advise the patient that if he would like to be able to discontinue glycemic agents then he will need to discontinue sugar sweetened beverages first, avoid snacks, and choose low carb snacks such as vegetables and nuts -I have also advised the patient to brisk walk 150 minutes a week and not to count his job as an activity -I also advised the patient to get his medical information through the [facility] website as it appears that he has been  talking to peers and getting misinformation  -I did also explain to the patient that fasting glucose goal is 70-130 mg/DL -He was given instructions on how to restart and titrate metformin -I did offer the patient once weekly GLP-1 agonist but he is not keen on this -  MEDICATIONS: Restart metformin 500 mg XR, 2 tabs twice daily Restart glipizide 10 mg twice daily  EDUCATION / INSTRUCTIONS: BG monitoring instructions: Patient is instructed to check his blood sugars 1 times a day. Call [facility] if: BG persistently < 70  I reviewed the Rule of 15 for the treatment of hypoglycemia in detail with the patient. Literature supplied.  2) Diabetic complications:  Eye: Does not have known diabetic retinopathy.  Neuro/ Feet: Does not have known diabetic peripheral neuropathy .  Renal: Patient does not have known baseline CKD. He   is not on an ACEI/ARB at present.    3) Hepatic steatosis:  -Patient is awaiting to see GI  -We discussed the importance of weight loss, I have offered him GLP-1 agonist but he declines at this time -We again emphasized the importance of lifestyle changes   4) Penile irritation:  -This is his main concern today -This is beyond the scope of endocrinology -Will refer to urology    F/U in 4 months   Signed electronically by: Mack Guise, MD  [facility]  [address] [facility] [address] [place], [place] [address] Phone: [phone] FAX: [phone]   CC: Camillia Herter, NP [place] [address] Phone: [phone]  Fax: [phone]  Return to Endocrinology clinic as below: Future Appointments  Date Time Provider [place]  06/18/2022  1:00 PM Chino Sardo, Melanie Crazier, MD [facility] None

## 2022-07-05 ENCOUNTER — Other Ambulatory Visit: Payer: Self-pay

## 2022-07-15 ENCOUNTER — Other Ambulatory Visit: Payer: Self-pay

## 2022-12-24 ENCOUNTER — Ambulatory Visit: Payer: Self-pay | Admitting: Internal Medicine

## 2023-01-07 ENCOUNTER — Other Ambulatory Visit: Payer: Self-pay | Admitting: Family

## 2023-01-07 DIAGNOSIS — K219 Gastro-esophageal reflux disease without esophagitis: Secondary | ICD-10-CM

## 2023-10-01 ENCOUNTER — Other Ambulatory Visit: Payer: Self-pay

## 2023-10-01 ENCOUNTER — Emergency Department (HOSPITAL_COMMUNITY): Payer: Self-pay

## 2023-10-01 ENCOUNTER — Emergency Department (HOSPITAL_COMMUNITY)
Admission: EM | Admit: 2023-10-01 | Discharge: 2023-10-01 | Disposition: A | Discharge status: Home / Self Care | Payer: Self-pay | Attending: Emergency Medicine | Admitting: Emergency Medicine

## 2023-10-01 DIAGNOSIS — S069X9A Unspecified intracranial injury with loss of consciousness of unspecified duration, initial encounter: Secondary | ICD-10-CM

## 2023-10-01 DIAGNOSIS — Z7984 Long term (current) use of oral hypoglycemic drugs: Secondary | ICD-10-CM | POA: Insufficient documentation

## 2023-10-01 DIAGNOSIS — E119 Type 2 diabetes mellitus without complications: Secondary | ICD-10-CM | POA: Insufficient documentation

## 2023-10-01 DIAGNOSIS — S02119A Unspecified fracture of occiput, initial encounter for closed fracture: Secondary | ICD-10-CM | POA: Insufficient documentation

## 2023-10-01 DIAGNOSIS — F17210 Nicotine dependence, cigarettes, uncomplicated: Secondary | ICD-10-CM | POA: Insufficient documentation

## 2023-10-01 DIAGNOSIS — W19XXXA Unspecified fall, initial encounter: Secondary | ICD-10-CM

## 2023-10-01 DIAGNOSIS — Y990 Civilian activity done for income or pay: Secondary | ICD-10-CM | POA: Insufficient documentation

## 2023-10-01 DIAGNOSIS — I10 Essential (primary) hypertension: Secondary | ICD-10-CM | POA: Insufficient documentation

## 2023-10-01 DIAGNOSIS — S60311A Abrasion of right thumb, initial encounter: Secondary | ICD-10-CM | POA: Insufficient documentation

## 2023-10-01 DIAGNOSIS — W11XXXA Fall on and from ladder, initial encounter: Secondary | ICD-10-CM | POA: Insufficient documentation

## 2023-10-01 LAB — COMPREHENSIVE METABOLIC PANEL WITH GFR
ALT: 41 U/L (ref 0–44)
AST: 36 U/L (ref 15–41)
Albumin: 3.8 g/dL (ref 3.5–5.0)
Alkaline Phosphatase: 61 U/L (ref 38–126)
Anion gap: 11 (ref 5–15)
BUN: 8 mg/dL (ref 6–20)
CO2: 21 mmol/L — ABNORMAL LOW (ref 22–32)
Calcium: 9.4 mg/dL (ref 8.9–10.3)
Chloride: 108 mmol/L (ref 98–111)
Creatinine, Ser: 0.87 mg/dL (ref 0.61–1.24)
GFR, Estimated: >60 mL/min (ref >60)
Glucose, Bld: 347 mg/dL — ABNORMAL HIGH (ref 70–99)
Potassium: 3.8 mmol/L (ref 3.5–5.1)
Sodium: 140 mmol/L (ref 135–145)
Total Bilirubin: 0.6 mg/dL (ref 0.0–1.2)
Total Protein: 6.8 g/dL (ref 6.5–8.1)

## 2023-10-01 LAB — CBC WITH DIFFERENTIAL/PLATELET
Abs Immature Granulocytes: 0.04 10*3/uL (ref 0.00–0.07)
Basophils Absolute: 0.1 10*3/uL (ref 0.0–0.1)
Basophils Relative: 1 %
Eosinophils Absolute: 0.3 10*3/uL (ref 0.0–0.5)
Eosinophils Relative: 2 %
HCT: 48.8 % (ref 39.0–52.0)
Hemoglobin: 17 g/dL (ref 13.0–17.0)
Immature Granulocytes: 0 %
Lymphocytes Relative: 43 %
Lymphs Abs: 4.6 10*3/uL — ABNORMAL HIGH (ref 0.7–4.0)
MCH: 31.2 pg (ref 26.0–34.0)
MCHC: 34.8 g/dL (ref 30.0–36.0)
MCV: 89.5 fL (ref 80.0–100.0)
Monocytes Absolute: 0.8 10*3/uL (ref 0.1–1.0)
Monocytes Relative: 7 %
Neutro Abs: 5.1 10*3/uL (ref 1.7–7.7)
Neutrophils Relative %: 47 %
Platelets: 211 10*3/uL (ref 150–400)
RBC: 5.45 MIL/uL (ref 4.22–5.81)
RDW: 12.2 % (ref 11.5–15.5)
WBC: 10.8 10*3/uL — ABNORMAL HIGH (ref 4.0–10.5)
nRBC: 0 % (ref 0.0–0.2)

## 2023-10-01 LAB — I-STAT CHEM 8, ED
BUN: 9 mg/dL (ref 6–20)
Calcium, Ion: 1.17 mmol/L (ref 1.15–1.40)
Chloride: 106 mmol/L (ref 98–111)
Creatinine, Ser: 0.8 mg/dL (ref 0.61–1.24)
Glucose, Bld: 349 mg/dL — ABNORMAL HIGH (ref 70–99)
HCT: 50 % (ref 39.0–52.0)
Hemoglobin: 17 g/dL (ref 13.0–17.0)
Potassium: 3.8 mmol/L (ref 3.5–5.1)
Sodium: 143 mmol/L (ref 135–145)
TCO2: 23 mmol/L (ref 22–32)

## 2023-10-01 LAB — CBG MONITORING, ED: Glucose-Capillary: 352 mg/dL — ABNORMAL HIGH (ref 70–99)

## 2023-10-01 MED ORDER — ONDANSETRON HCL 4 MG PO TABS: 4.0000 mg | ORAL_TABLET | Freq: Four times a day (QID) | ORAL | 0 refills | Status: AC

## 2023-10-01 MED ORDER — FENTANYL CITRATE PF 50 MCG/ML IJ SOSY
100.0000 ug | PREFILLED_SYRINGE | Freq: Once | INTRAMUSCULAR | Status: AC
  Administered 2023-10-01: 100 ug via INTRAVENOUS
  Filled 2023-10-01: qty 2

## 2023-10-01 MED ORDER — ONDANSETRON HCL 4 MG/2ML IJ SOLN
INTRAMUSCULAR | Status: AC
  Filled 2023-10-01: qty 2

## 2023-10-01 MED ORDER — ONDANSETRON HCL 4 MG/2ML IJ SOLN
4.0000 mg | Freq: Once | INTRAMUSCULAR | Status: AC
  Administered 2023-10-01: 4 mg via INTRAVENOUS

## 2023-10-01 MED ORDER — CYCLOBENZAPRINE HCL 10 MG PO TABS: 10.0000 mg | ORAL_TABLET | Freq: Two times a day (BID) | ORAL | 0 refills | Status: AC | PRN

## 2023-10-01 MED ORDER — IOHEXOL 350 MG/ML SOLN
100.0000 mL | Freq: Once | INTRAVENOUS | Status: AC | PRN
  Administered 2023-10-01: 100 mL via INTRAVENOUS

## 2023-10-01 NOTE — ED Triage Notes (Addendum)
 Patient arrives via [facility] for a fall off the top of a 6 ft ladder. Witnessed LOC by bystander for 1-3 min. Patient did hit head complaining of severe headache. Alert and oriented x4. NO obvious injury, lac to right thumb and hematoma to back of head. 18 LAC. PERRLA.   BP 200/100, now 157/100, HR 97, 97 on room air. CBG 309

## 2023-10-01 NOTE — ED Notes (Signed)
 Patient transported to X-ray

## 2023-10-01 NOTE — Progress Notes (Signed)
   10/01/23 1120  Spiritual Encounters  Conversation partners present during Programmer, systems  Reason for visit Trauma  OnCall Visit No    Chaplain spoke with RN, who indicated no needs at this time. Chaplains remain available.

## 2023-10-01 NOTE — Progress Notes (Signed)
 Orthopedic Tech Progress Note Patient Details:  Waylyn Tenbrink Aug 05, 1980 [id number]  Level 2 trauma  Patient ID: Aloysius Eric Pintos, male   DOB: [birth year], 43 y.o.   MRN: [id number]  RITIK STAVOLA 10/01/2023, 11:08 AM

## 2023-10-01 NOTE — ED Provider Notes (Signed)
 [place] EMERGENCY DEPARTMENT AT [facility] Provider Note  CSN: [id number] Arrival date & time: 10/01/23 1041  Chief Complaint(s) Fall  HPI Joshua Mcneil is a 43 y.o. male who is here today after he fell off of a ladder.  Patient was at work, was at the top of a 6 foot ladder, fell off and landed on his head.  He is not any blood thinners.  He reportedly had a loss of consciousness of a couple of minutes long.  Patient with a history of diabetes.   Past Medical History Past Medical History:  Diagnosis Date   Diabetes mellitus without complication (HCC)    Hypertension    Patient Active Problem List   Diagnosis Date Noted   Hepatic steatosis 06/18/2022   Herpes genitalis 10/26/2021   Uncontrolled type 2 diabetes mellitus with hyperglycemia, without long-term current use of insulin  (HCC) 08/20/2021   Essential hypertension 08/20/2021   Peritonsillar abscess 08/20/2021   Sepsis secondary to Group A strep pharyngitis and likely right peritonsillar abscess  08/20/2021   Group A streptococcal infection 08/20/2021   GERD (gastroesophageal reflux disease) 08/20/2021   Hyperlipidemia 08/20/2021   Snoring/obesity  08/20/2021   Tobacco abuse 08/20/2021   Home Medication(s) Prior to Admission medications   Medication Sig Start Date End Date Taking? Authorizing Provider  cyclobenzaprine  (FLEXERIL ) 10 MG tablet Take 1 tablet (10 mg total) by mouth 2 (two) times daily as needed for muscle spasms. 10/01/23  Yes Mannie Pac T, DO  ondansetron  (ZOFRAN ) 4 MG tablet Take 1 tablet (4 mg total) by mouth every 6 (six) hours. 10/01/23  Yes Mannie Pac T, DO  atorvastatin  (LIPITOR) 10 MG tablet Take 1 tablet (10 mg total) by mouth at bedtime. 08/31/21 02/27/22  Lorren Greig PARAS, NP  glipiZIDE  (GLUCOTROL ) 10 MG tablet Take 1 tablet (10 mg total) by mouth 2 (two) times daily before a meal. 06/18/22   Shamleffer, Donell Cardinal, MD  metFORMIN  (GLUCOPHAGE -XR) 500 MG 24 hr tablet  Take 2 tablets (1,000 mg total) by mouth 2 (two) times daily with a meal. 06/18/22   Shamleffer, Donell Cardinal, MD  nicotine  (NICODERM CQ  - DOSED IN MG/24 HOURS) 14 mg/24hr patch Place 1 patch (14 mg total) onto the skin daily. Patient not taking: Reported on 06/18/2022 08/31/21   Lorren Greig PARAS, NP  pantoprazole  (PROTONIX ) 40 MG tablet Take 1 tablet (40 mg total) by mouth daily. 08/31/21 02/27/22  Lorren Greig PARAS, NP  valACYclovir  (VALTREX ) 1000 MG tablet Take 1 tablet (1,000 mg total) by mouth 2 (two) times daily. Patient not taking: Reported on 06/18/2022 10/26/21   Lorren Greig PARAS, NP                                                                                                                                    Past Surgical History No past surgical history on file. Family History No family history on file.  Social History Social History   Tobacco Use   Smoking status: Every Day    Current packs/day: 0.50    Types: Cigarettes    Passive exposure: Current   Smokeless tobacco: Never  Vaping Use   Vaping status: Never Used  Substance Use Topics   Alcohol use: Yes    Alcohol/week: 12.0 standard drinks of alcohol    Types: 12 Cans of beer per week    Comment: pt states he does not drink a 12 pack every week   Drug use: No   Allergies Patient has no known allergies.  Review of Systems Review of Systems  Physical Exam Vital Signs  I have reviewed the triage vital signs BP 105/69   Pulse 81   Temp 97.6 F (36.4 C) (Oral)   Resp 18   Ht 5' 11 (1.803 m)   Wt 127 kg   SpO2 97%   BMI 39.05 kg/m   Physical Exam Vitals and nursing note reviewed.  Constitutional:      Appearance: Normal appearance.  HENT:     Head: Normocephalic.     Comments: Contusion to the occiput Neck:     Comments: Cervical collar in place.  No midline spinous process tenderness Cardiovascular:     Rate and Rhythm: Normal rate.     Pulses: Normal pulses.  Pulmonary:     Effort: Pulmonary  effort is normal.     Breath sounds: Normal breath sounds.  Abdominal:     General: Abdomen is flat.     Palpations: Abdomen is soft.     Tenderness: There is no guarding.   Musculoskeletal:        General: Normal range of motion.     Comments: Small abrasion of the right thumb.  No tenderness to palpation in the bilateral shoulders, upper arms, elbows, forearms or wrists.  No tenderness to palpation in the chest.  Pelvis stable, nontender.  No tenderness, deformities noted on bilateral upper legs, knees, lower legs or ankles.  Patient able to lift both legs from the bed.   Skin:    General: Skin is warm.   Neurological:     General: No focal deficit present.     Mental Status: He is alert.     Comments: 5-5 grip strength in bilateral upper extremities.  5-5 straight leg raise bilaterally.    ED Results and Treatments Labs (all labs ordered are listed, but only abnormal results are displayed) Labs Reviewed  COMPREHENSIVE METABOLIC PANEL WITH GFR - Abnormal; Notable for the following components:      Result Value   CO2 21 (*)    Glucose, Bld 347 (*)    All other components within normal limits  CBC WITH DIFFERENTIAL/PLATELET - Abnormal; Notable for the following components:   WBC 10.8 (*)    Lymphs Abs 4.6 (*)    All other components within normal limits  I-STAT CHEM 8, ED - Abnormal; Notable for the following components:   Glucose, Bld 349 (*)    All other components within normal limits  CBG MONITORING, ED - Abnormal; Notable for the following components:   Glucose-Capillary 352 (*)    All other components within normal limits  Radiology DG Orthopantogram Result Date: 10/01/2023 CLINICAL DATA:  Left mandibular pain. EXAM: ORTHOPANTOGRAM/PANORAMIC COMPARISON:  Same day. FINDINGS: No fracture or lytic lesion is noted. Possible anterior subluxation of left  temporomandibular joint. IMPRESSION: No fracture. Possible anterior subluxation of left temporomandibular joint. Electronically Signed   By: Lynwood Landy Raddle M.D.   On: 10/01/2023 14:54   DG Shoulder Right Result Date: 10/01/2023 CLINICAL DATA:  Trauma and fall. EXAM: RIGHT SHOULDER - 2+ VIEW COMPARISON:  Chest radiograph 10/01/2023 FINDINGS: Right shoulder is located. Negative for a fracture. Normal alignment at the right [facility] joint. Visualized right ribs are intact. IMPRESSION: Negative. Electronically Signed   By: Juliene Balder M.D.   On: 10/01/2023 13:02   DG Chest Port 1 View Result Date: 10/01/2023 CLINICAL DATA:  Fall from ladder. EXAM: PORTABLE CHEST 1 VIEW COMPARISON:  Same day CT chest, abdomen, and pelvis dated 10/01/2023. Chest radiograph dated 08/24/2020. FINDINGS: Low lung volumes with associated accentuation of the cardiomediastinal silhouette. No focal consolidation, pleural effusion, or pneumothorax. No acute osseous abnormality. IMPRESSION: Low lung volumes.  No acute findings in the chest. Electronically Signed   By: Harrietta Sherry M.D.   On: 10/01/2023 12:19   CT CHEST ABDOMEN PELVIS W CONTRAST Result Date: 10/01/2023 CLINICAL DATA:  Polytrauma, blunt.  Fall from 6 ft ladder. EXAM: CT CHEST, ABDOMEN, AND PELVIS WITH CONTRAST TECHNIQUE: Multidetector CT imaging of the chest, abdomen and pelvis was performed following the standard protocol during bolus administration of intravenous contrast. RADIATION DOSE REDUCTION: This exam was performed according to the departmental dose-optimization program which includes automated exposure control, adjustment of the mA and/or kV according to patient size and/or use of iterative reconstruction technique. CONTRAST:  OMNIPAQUE  IOHEXOL  350 MG/ML SOLN COMPARISON:  None Available. FINDINGS: CT CHEST FINDINGS Cardiovascular: No significant vascular findings. Normal heart size. No pericardial effusion. Thoracic aorta is normal in caliber. Central pulmonary  arteries are normal in caliber. Mediastinum/Nodes: No enlarged mediastinal, hilar, or axillary lymph nodes. Thyroid gland, trachea, and esophagus demonstrate no significant findings. Lungs/Pleura: No pleural effusion, or pneumothorax. Dependent changes in the bilateral lower lobes. Streaky linear opacity in the lingula could reflect atelectasis or scarring, although, possibility of contusion can not be entirely excluded in the setting of trauma. Musculoskeletal: No acute osseous abnormality. No chest wall abnormality. CT ABDOMEN PELVIS FINDINGS Hepatobiliary: No hepatic injury or perihepatic hematoma. Diffusely decreased hepatic attenuation, compatible with hepatic steatosis. No suspicious focal hepatic lesion. Gallbladder is unremarkable. Pancreas: Unremarkable. No pancreatic ductal dilatation or surrounding inflammatory changes. Spleen: No splenic injury or perisplenic hematoma. Normal in size without focal abnormality. Adrenals/Urinary Tract: No adrenal hemorrhage or renal injury identified. Kidneys enhance symmetrically. No urolithiasis or hydronephrosis. Bladder is unremarkable. Stomach/Bowel: Stomach is within normal limits. Appendix appears normal. No evidence of bowel wall thickening, distention, or inflammatory changes. Vascular/Lymphatic: Abdominal aorta is normal in caliber with mild aortoiliac atherosclerotic calcification. No enlarged abdominal or pelvic lymph nodes. Reproductive: Prostate is unremarkable. Other: No abdominopelvic ascites. No intraperitoneal free air. Small fat containing umbilical hernia. Musculoskeletal: No acute or significant osseous findings. IMPRESSION: 1. Streaky linear opacity in the lingula could reflect atelectasis or scarring, although, possibility of contusion can not be entirely excluded in the setting of trauma. 2. Otherwise, no acute traumatic findings in the chest, abdomen, or pelvis. 3. Hepatic steatosis. Electronically Signed   By: Harrietta Sherry M.D.   On:  10/01/2023 12:17   CT HEAD WO CONTRAST Result Date: 10/01/2023 CLINICAL DATA:  Trauma, fall, headache EXAM:  CT HEAD WITHOUT CONTRAST CT MAXILLOFACIAL WITHOUT CONTRAST CT CERVICAL SPINE WITHOUT CONTRAST TECHNIQUE: Multidetector CT imaging of the head, cervical spine, and maxillofacial structures were performed using the standard protocol without intravenous contrast. Multiplanar CT image reconstructions of the cervical spine and maxillofacial structures were also generated. RADIATION DOSE REDUCTION: This exam was performed according to the departmental dose-optimization program which includes automated exposure control, adjustment of the mA and/or kV according to patient size and/or use of iterative reconstruction technique. COMPARISON:  None Available. FINDINGS: CT HEAD FINDINGS Brain: No evidence of acute infarction, hemorrhage, hydrocephalus, extra-axial collection or mass lesion/mass effect. Vascular: No hyperdense vessel or unexpected calcification. CT FACIAL BONES FINDINGS Skull: Nondisplaced left eccentric vertically oriented fracture of the occipital bone (series 4, image 21) Facial bones: No displaced fractures. Anterior dislocation of the left temporomandibular joint (series 8, image 63). No associated fracture. Sinuses/Orbits: No acute finding. Other: Soft tissue contusion and hematoma of the left scalp vertex (series 3, image 20). CT CERVICAL SPINE FINDINGS Alignment: Normal. Skull base and vertebrae: No acute fracture. No primary bone lesion or focal pathologic process. Soft tissues and spinal canal: No prevertebral fluid or swelling. No visible canal hematoma. Disc levels:  Intact. Upper chest: Negative. Other: None. IMPRESSION: 1. No acute intracranial pathology. 2. Nondisplaced, left eccentric vertically oriented fracture of the occipital bone. 3. Anterior dislocation of the left temporomandibular joint. No associated fracture. 4. Soft tissue contusion and hematoma of the left scalp vertex. 5. No  fracture or static subluxation of the cervical spine. Electronically Signed   By: Marolyn JONETTA Jaksch M.D.   On: 10/01/2023 12:10   CT MAXILLOFACIAL WO CONTRAST Result Date: 10/01/2023 CLINICAL DATA:  Trauma, fall, headache EXAM: CT HEAD WITHOUT CONTRAST CT MAXILLOFACIAL WITHOUT CONTRAST CT CERVICAL SPINE WITHOUT CONTRAST TECHNIQUE: Multidetector CT imaging of the head, cervical spine, and maxillofacial structures were performed using the standard protocol without intravenous contrast. Multiplanar CT image reconstructions of the cervical spine and maxillofacial structures were also generated. RADIATION DOSE REDUCTION: This exam was performed according to the departmental dose-optimization program which includes automated exposure control, adjustment of the mA and/or kV according to patient size and/or use of iterative reconstruction technique. COMPARISON:  None Available. FINDINGS: CT HEAD FINDINGS Brain: No evidence of acute infarction, hemorrhage, hydrocephalus, extra-axial collection or mass lesion/mass effect. Vascular: No hyperdense vessel or unexpected calcification. CT FACIAL BONES FINDINGS Skull: Nondisplaced left eccentric vertically oriented fracture of the occipital bone (series 4, image 21) Facial bones: No displaced fractures. Anterior dislocation of the left temporomandibular joint (series 8, image 63). No associated fracture. Sinuses/Orbits: No acute finding. Other: Soft tissue contusion and hematoma of the left scalp vertex (series 3, image 20). CT CERVICAL SPINE FINDINGS Alignment: Normal. Skull base and vertebrae: No acute fracture. No primary bone lesion or focal pathologic process. Soft tissues and spinal canal: No prevertebral fluid or swelling. No visible canal hematoma. Disc levels:  Intact. Upper chest: Negative. Other: None. IMPRESSION: 1. No acute intracranial pathology. 2. Nondisplaced, left eccentric vertically oriented fracture of the occipital bone. 3. Anterior dislocation of the left  temporomandibular joint. No associated fracture. 4. Soft tissue contusion and hematoma of the left scalp vertex. 5. No fracture or static subluxation of the cervical spine. Electronically Signed   By: Marolyn JONETTA Jaksch M.D.   On: 10/01/2023 12:10   CT CERVICAL SPINE WO CONTRAST Result Date: 10/01/2023 CLINICAL DATA:  Trauma, fall, headache EXAM: CT HEAD WITHOUT CONTRAST CT MAXILLOFACIAL WITHOUT CONTRAST CT CERVICAL SPINE WITHOUT CONTRAST TECHNIQUE: Multidetector  CT imaging of the head, cervical spine, and maxillofacial structures were performed using the standard protocol without intravenous contrast. Multiplanar CT image reconstructions of the cervical spine and maxillofacial structures were also generated. RADIATION DOSE REDUCTION: This exam was performed according to the departmental dose-optimization program which includes automated exposure control, adjustment of the mA and/or kV according to patient size and/or use of iterative reconstruction technique. COMPARISON:  None Available. FINDINGS: CT HEAD FINDINGS Brain: No evidence of acute infarction, hemorrhage, hydrocephalus, extra-axial collection or mass lesion/mass effect. Vascular: No hyperdense vessel or unexpected calcification. CT FACIAL BONES FINDINGS Skull: Nondisplaced left eccentric vertically oriented fracture of the occipital bone (series 4, image 21) Facial bones: No displaced fractures. Anterior dislocation of the left temporomandibular joint (series 8, image 63). No associated fracture. Sinuses/Orbits: No acute finding. Other: Soft tissue contusion and hematoma of the left scalp vertex (series 3, image 20). CT CERVICAL SPINE FINDINGS Alignment: Normal. Skull base and vertebrae: No acute fracture. No primary bone lesion or focal pathologic process. Soft tissues and spinal canal: No prevertebral fluid or swelling. No visible canal hematoma. Disc levels:  Intact. Upper chest: Negative. Other: None. IMPRESSION: 1. No acute intracranial pathology. 2.  Nondisplaced, left eccentric vertically oriented fracture of the occipital bone. 3. Anterior dislocation of the left temporomandibular joint. No associated fracture. 4. Soft tissue contusion and hematoma of the left scalp vertex. 5. No fracture or static subluxation of the cervical spine. Electronically Signed   By: Marolyn JONETTA Jaksch M.D.   On: 10/01/2023 12:10   CT T-SPINE NO CHARGE Result Date: 10/01/2023 CLINICAL DATA:  Fall back pain. EXAM: CT THORACIC AND LUMBAR SPINE WITHOUT CONTRAST TECHNIQUE: Multidetector CT imaging of the thoracic and lumbar spine was performed without contrast. Multiplanar CT image reconstructions were also generated. RADIATION DOSE REDUCTION: This exam was performed according to the departmental dose-optimization program which includes automated exposure control, adjustment of the mA and/or kV according to patient size and/or use of iterative reconstruction technique. COMPARISON:  CT of the chest abdomen pelvis dated 10/01/2023. FINDINGS: CT THORACIC SPINE FINDINGS Alignment: No acute subluxation. Vertebrae: No acute fracture. Paraspinal and other soft tissues: Negative. Disc levels: No acute findings. No significant degenerative changes. CT LUMBAR SPINE FINDINGS Segmentation: 5 lumbar type vertebrae. Alignment: No acute subluxation. Vertebrae: No acute fracture. Paraspinal and other soft tissues: No acute findings. No paraspinal fluid collection or hematoma. Disc levels: No acute findings.  Mild degenerative changes. IMPRESSION: No acute/traumatic thoracic or lumbar spine pathology. Electronically Signed   By: Vanetta Chou M.D.   On: 10/01/2023 12:09   CT L-SPINE NO CHARGE Result Date: 10/01/2023 CLINICAL DATA:  Fall back pain. EXAM: CT THORACIC AND LUMBAR SPINE WITHOUT CONTRAST TECHNIQUE: Multidetector CT imaging of the thoracic and lumbar spine was performed without contrast. Multiplanar CT image reconstructions were also generated. RADIATION DOSE REDUCTION: This exam was  performed according to the departmental dose-optimization program which includes automated exposure control, adjustment of the mA and/or kV according to patient size and/or use of iterative reconstruction technique. COMPARISON:  CT of the chest abdomen pelvis dated 10/01/2023. FINDINGS: CT THORACIC SPINE FINDINGS Alignment: No acute subluxation. Vertebrae: No acute fracture. Paraspinal and other soft tissues: Negative. Disc levels: No acute findings. No significant degenerative changes. CT LUMBAR SPINE FINDINGS Segmentation: 5 lumbar type vertebrae. Alignment: No acute subluxation. Vertebrae: No acute fracture. Paraspinal and other soft tissues: No acute findings. No paraspinal fluid collection or hematoma. Disc levels: No acute findings.  Mild degenerative changes. IMPRESSION: No acute/traumatic thoracic  or lumbar spine pathology. Electronically Signed   By: Vanetta Chou M.D.   On: 10/01/2023 12:09   DG Pelvis Portable Result Date: 10/01/2023 CLINICAL DATA:  Fall from ladder. EXAM: PORTABLE PELVIS 1-2 VIEWS COMPARISON:  10/16/2012. FINDINGS: There is no evidence of pelvic fracture or diastasis. Femoral heads are seated within the acetabula. Sacroiliac joints and pubic symphysis are anatomically aligned. IMPRESSION: No acute osseous abnormality. Electronically Signed   By: Harrietta Sherry M.D.   On: 10/01/2023 11:43    Pertinent labs & imaging results that were available during my care of the patient were reviewed by me and considered in my medical decision making (see MDM for details).  Medications Ordered in ED Medications  fentaNYL (SUBLIMAZE) injection 100 mcg (100 mcg Intravenous Given 10/01/23 1115)  iohexol  (OMNIPAQUE ) 350 MG/ML injection 100 mL (100 mLs Intravenous Contrast Given 10/01/23 1133)  ondansetron  (ZOFRAN ) injection 4 mg (4 mg Intravenous Not Given 10/01/23 1152)  fentaNYL (SUBLIMAZE) injection 100 mcg (100 mcg Intravenous Given 10/01/23 1259)                                                                                                                                      Procedures .Critical Care  Performed by: Mannie Fairy DASEN, DO Authorized by: Mannie Fairy DASEN, DO   Critical care provider statement:    Critical care time (minutes):  30   Critical care was necessary to treat or prevent imminent or life-threatening deterioration of the following conditions:  Trauma   Critical care was time spent personally by me on the following activities:  Development of treatment plan with patient or surrogate, discussions with consultants, evaluation of patient's response to treatment, examination of patient, ordering and review of laboratory studies, ordering and review of radiographic studies, ordering and performing treatments and interventions, pulse oximetry, re-evaluation of patient's condition and review of old charts   (including critical care time)  Medical Decision Making / ED Course   This patient presents to the ED for concern of traumatic injury, loss of consciousness, this involves an extensive number of treatment options, and is a complaint that carries with it a high risk of complications and morbidity.  The differential diagnosis includes ICH, C-spine injury, T-spine fracture, L-spine fracture, intra-abdominal injury  MDM: Patient with findings concerning for blunt head trauma.  Patient fell from a height, landed on his head and back.  Given the mechanism of injury, will obtain broad CT imaging of the patient.  No neurological deficits identified on exam.  Patient kept in cervical collar, logroll precautions maintained.  Reassessment 3 PM-patient's CT imaging of his head shows a nondisplaced linear fracture of the occipital bone.  This does not require any additional intervention or follow-up.  No indication for neurosurgery consultation based on the nature of this injury.  Patient had a jaw dislocation which we were able to reduce with syringe method.  Patient  able  to open and close mouth without any difficulty.  Does still endorse some soreness in the area, however palpation over the TMJ and repeat films show good approximation.  There is still some small subluxation on plain films, however clinically patient's jaw is reduced.  Patient's remainder of images negative.  He has been ambulatory in the ED.  Will discharge.     Additional history obtained: -Additional history obtained from wife at bedside -External records from outside source obtained and reviewed including: Chart review including previous notes, labs, imaging, consultation notes   Lab Tests: -I ordered, reviewed, and interpreted labs.   The pertinent results include:   Labs Reviewed  COMPREHENSIVE METABOLIC PANEL WITH GFR - Abnormal; Notable for the following components:      Result Value   CO2 21 (*)    Glucose, Bld 347 (*)    All other components within normal limits  CBC WITH DIFFERENTIAL/PLATELET - Abnormal; Notable for the following components:   WBC 10.8 (*)    Lymphs Abs 4.6 (*)    All other components within normal limits  I-STAT CHEM 8, ED - Abnormal; Notable for the following components:   Glucose, Bld 349 (*)    All other components within normal limits  CBG MONITORING, ED - Abnormal; Notable for the following components:   Glucose-Capillary 352 (*)    All other components within normal limits      EKG my independent review of the patient's EKG shows no ST segment depressions or elevations, no T wave inversions, no evidence of acute ischemia.  EKG Interpretation Date/Time:    Ventricular Rate:    PR Interval:    QRS Duration:    QT Interval:    QTC Calculation:   R Axis:      Text Interpretation:           Imaging Studies ordered: I ordered imaging studies including trauma pan scan I independently visualized and interpreted imaging. I agree with the radiologist interpretation   Medicines ordered and prescription drug management: Meds ordered  this encounter  Medications   fentaNYL (SUBLIMAZE) injection 100 mcg   iohexol  (OMNIPAQUE ) 350 MG/ML injection 100 mL   ondansetron  (ZOFRAN ) injection 4 mg   ondansetron  (ZOFRAN ) 4 MG/2ML injection    Troxler, Alan L: cabinet override   fentaNYL (SUBLIMAZE) injection 100 mcg   ondansetron  (ZOFRAN ) 4 MG tablet    Sig: Take 1 tablet (4 mg total) by mouth every 6 (six) hours.    Dispense:  12 tablet    Refill:  0   cyclobenzaprine  (FLEXERIL ) 10 MG tablet    Sig: Take 1 tablet (10 mg total) by mouth 2 (two) times daily as needed for muscle spasms.    Dispense:  20 tablet    Refill:  0    -I have reviewed the patients home medicines and have made adjustments as needed  Critical interventions Management of trauma  Cardiac Monitoring: The patient was maintained on a cardiac monitor.  I personally viewed and interpreted the cardiac monitored which showed an underlying rhythm of: Normal sinus rhythm  Reevaluation: After the interventions noted above, I reevaluated the patient and found that they have :improved  Co morbidities that complicate the patient evaluation  Past Medical History:  Diagnosis Date   Diabetes mellitus without complication (HCC)    Hypertension       Dispostion: I considered admission for this patient, however with his imaging he is appropriate for discharge.     Final Clinical Impression(s) /  ED Diagnoses Final diagnoses:  Fall, initial encounter  Fracture of occipital bone of skull with loss of consciousness (HCC)     @PCDICTATION @    Mannie Pac T, DO 10/01/23 1512

## 2023-10-01 NOTE — Discharge Instructions (Addendum)
 You have a small fracture at the back of your head.  This will heal on its own, does not require any additional treatment.  Like we discussed, you likely have a concussion.  This is essentially a bruise on your brain.  Often times in people have a concussion, they can feel headaches, nauseated, or feel sensitive to light.  It can be difficult to say what will make the symptoms worse, but that is your brains way of saying that you need to stop what you are doing and rest.  You can take Motrin , Tylenol  and Zofran  for this.  Most people symptoms will improve over days to weeks.  You can sleep.  For pain, you can take 1000 mg of Tylenol  every 8 hours, 40 mg of ibuprofen  every 6 hours.  You can take 10 mg of Flexeril  2 times per day.  Do not drive, operate machinery, or get on a ladder if you have had any Flexeril .  You can take Zofran  for nausea.  It is very normal to feel very sore over the next few days.  Return to the emergency room if you lose, has noticed, or develop persistent vomiting.  I have sent you some medications to your pharmacy.

## 2023-10-01 NOTE — ED Notes (Signed)
 EDP at bedside for manual reduction

## 2023-10-01 NOTE — ED Notes (Signed)
 Trauma Response Nurse Documentation  Tarrence Enck is a 43 y.o. male arriving to [facility] ED via EMS  Trauma was activated as a Level 2 based on the following trauma criteria Discretion of Emergency Department Physician due to fall with +LOC.  Patient cleared for CT by Dr. Mannie. Pt transported to CT with trauma response nurse present to monitor. RN remained with the patient throughout their absence from the department for clinical observation.   GCS 14-15, knows everything except for events leading up to fall.  History   Past Medical History:  Diagnosis Date   Diabetes mellitus without complication (HCC)    Hypertension      No past surgical history on file.   Initial Focused Assessment (If applicable, or please see trauma documentation): Patient A&Ox3, unable to recall events leading to fall, PERR 3, +LOC, C-collar placed by EMS Airway intact, bilateral breath sounds Pulses 2+  CT's Completed:   CT Head, CT Maxillofacial, CT C-Spine, CT Chest w/ contrast, and CT abdomen/pelvis w/ contrast +L/Tspine  Interventions:  IV, labs CXR/PXR CT Head/Cspine/Maxillofacial/T+Lspine/C/A/P 100mcg Fentanyl/4mg  Zofran   Plan for disposition:  Discharge home   Consults completed:  Neurosurgeon at see charting.  Event Summary: Patient to ED after a witnessed fall approx 6 ft from the top of a ladder. Per bystander, patient was cutting a limb and suddenly fell backwards from the ladder first striking his head and then the rest of his body. Limb did not hit patient, unsure what caused the fall. Patient presents with hypertension per EMS, severe posterior head pain and R shoulder pain. C-collar placed by EMS. Imaging was ordered and revealed skull fx, mandible dislocation. Consults were placed and recommend outpatient follow-up. Patient able to discharge home with wife.  Bedside handoff with ED RN Chiquita.    Alan CROME Diallo Ponder  Trauma Response RN  Please call TRN at [phone] for  further assistance.

## 2023-10-01 NOTE — ED Notes (Signed)
 XR at bedside

## 2023-10-01 NOTE — ED Notes (Signed)
 Discharged by RN with no additional questions for RN. In wheelchair to lobby to go home with wife.

## 2023-10-25 IMAGING — CT CT NECK W/ CM
4 of 5 series · 14 of 33 positions shown, 16 images · IV contrast (APPLIED)
Comparison: None Available.

CLINICAL DATA: Right-sided face and ear pain beginning a week ago
with swelling. Difficulty swelling

EXAM:
CT NECK WITH CONTRAST
TECHNIQUE: Multidetector CT imaging of the neck was performed using the
standard protocol following the bolus administration of intravenous
contrast.

[Series 4: axial bone · axial · 0.56mm/px · z∈[-196,-72]mm · 3 of 124 slices shown, 4 images]
[im 31/124  soft-tissue]
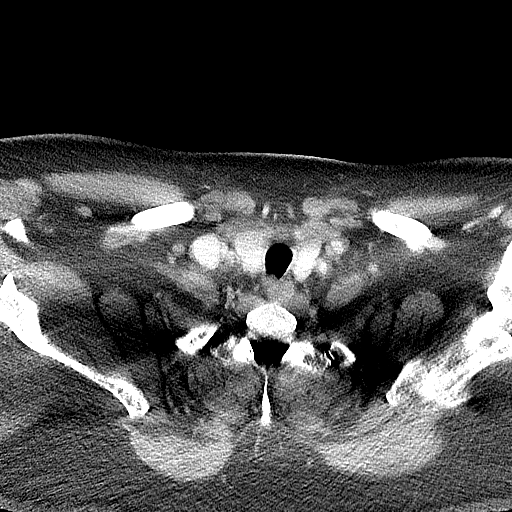
[im 31/124  bone]
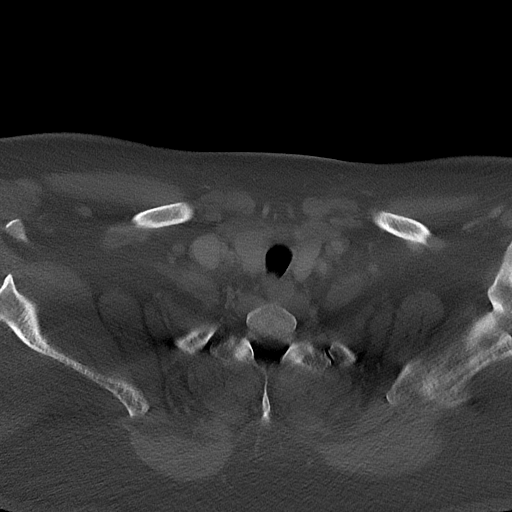
[im 62/124  bone]
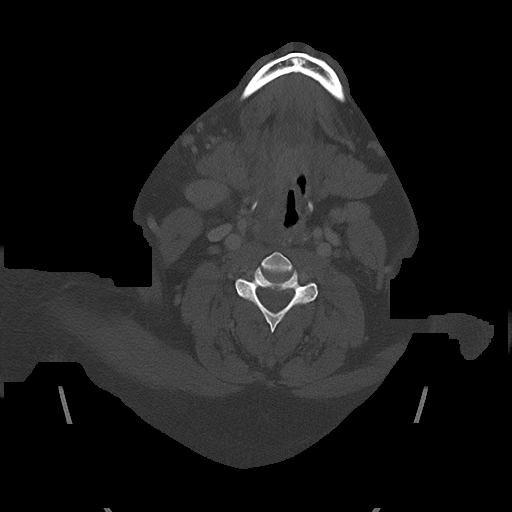
[im 93/124  bone]
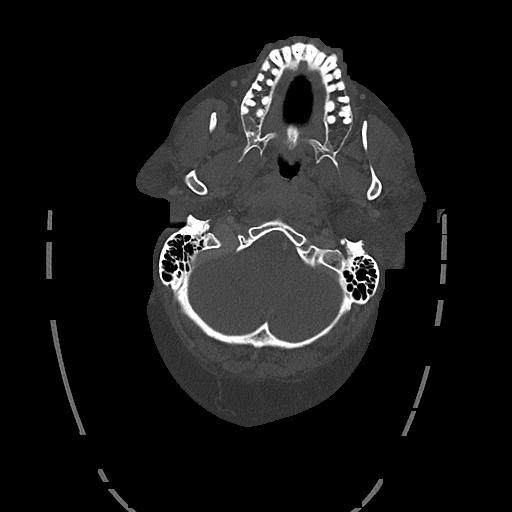

[Series 5: sag neck · sagittal · 0.52mm/px · 5 of 104 slices shown, 6 images]
[im 35/104  bone]
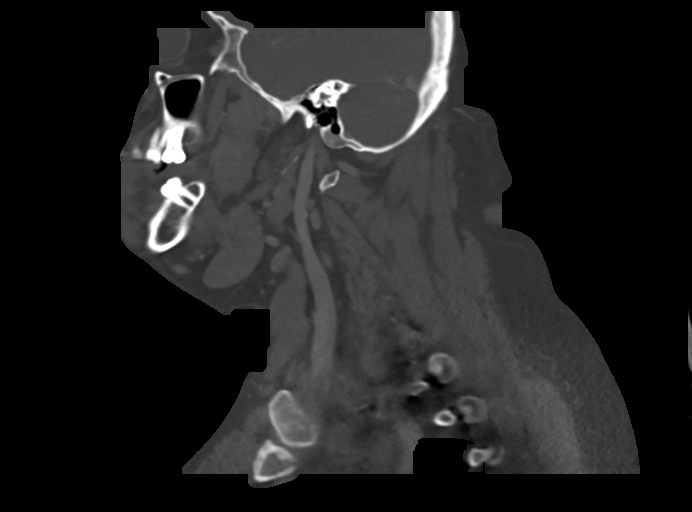
[im 43/104  bone]
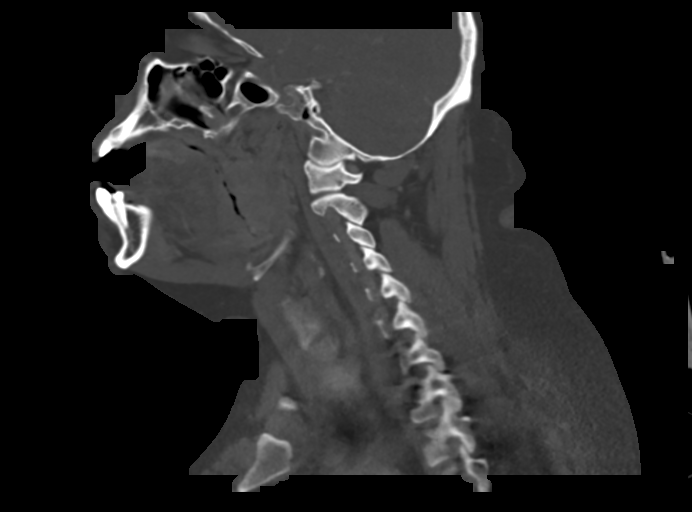
[im 52/104  soft-tissue]
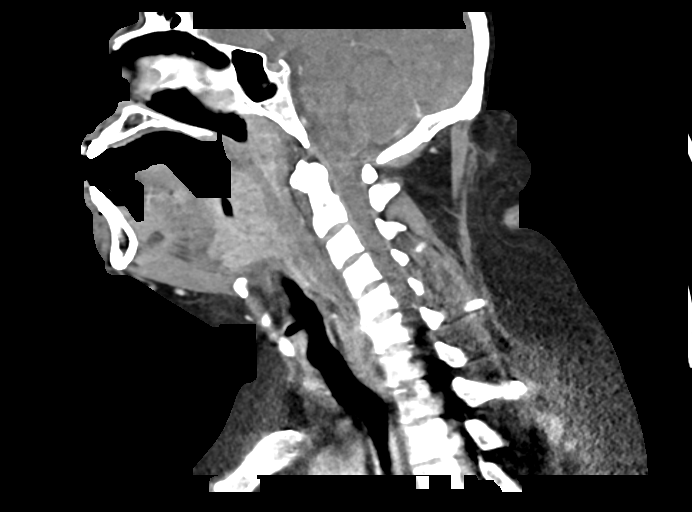
[im 52/104  bone]
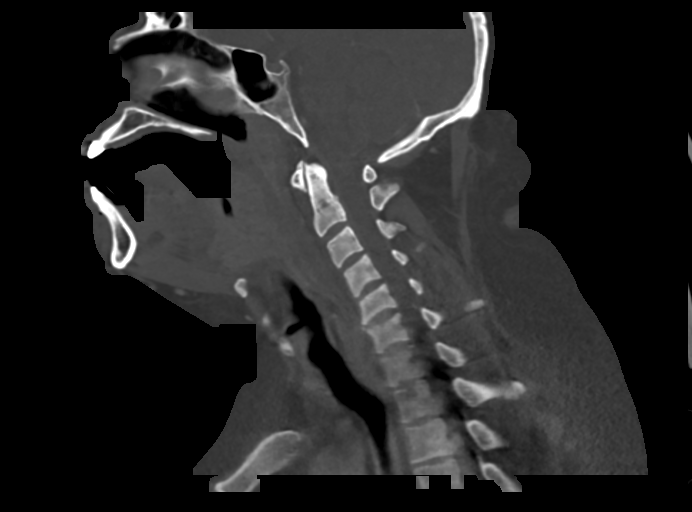
[im 61/104  bone]
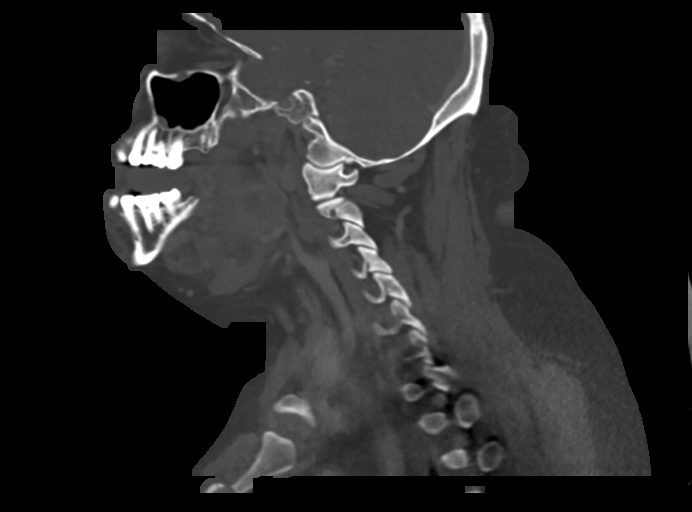
[im 69/104  bone]
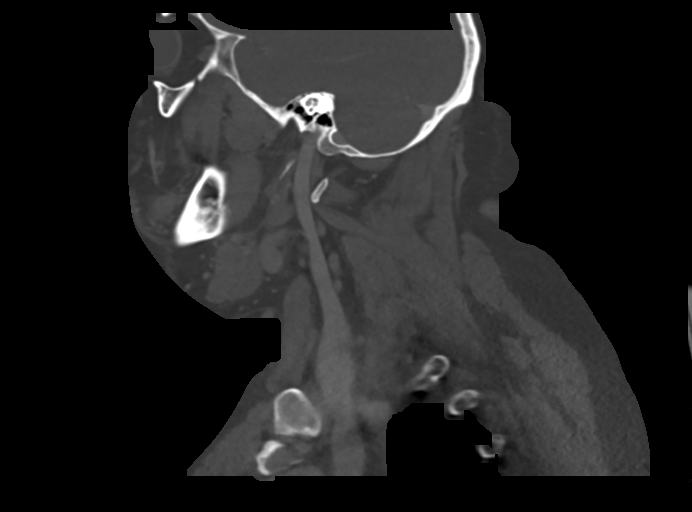

[Series 6: cor neck · coronal · 0.52mm/px · 3 of 145 slices shown]
[im 29/145  bone]
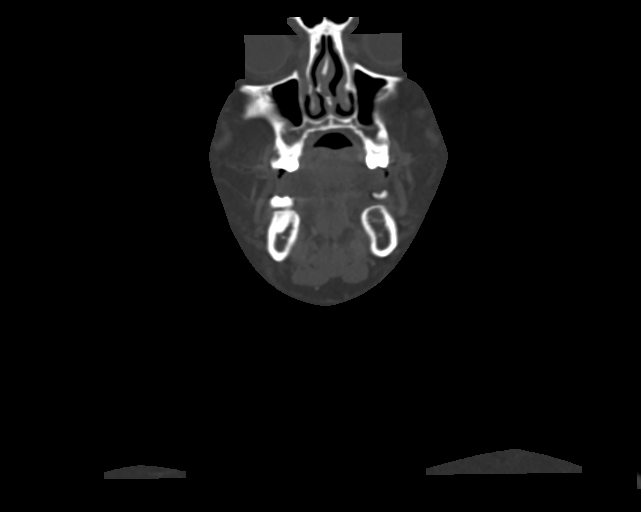
[im 58/145  bone]
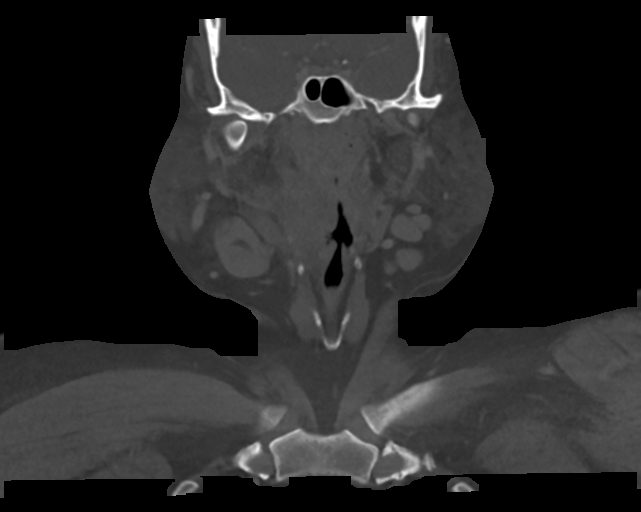
[im 87/145  bone]
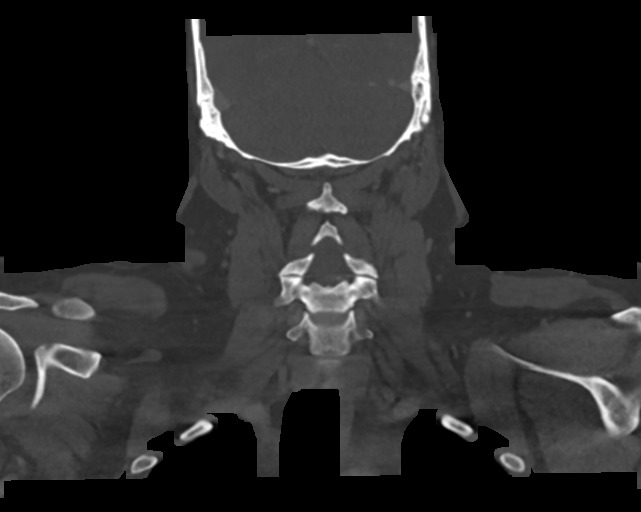

[Series 7: ax oropharynx · axial · 0.45mm/px · z∈[-249,-129]mm · 3 of 130 slices shown]
[im 33/130  bone]
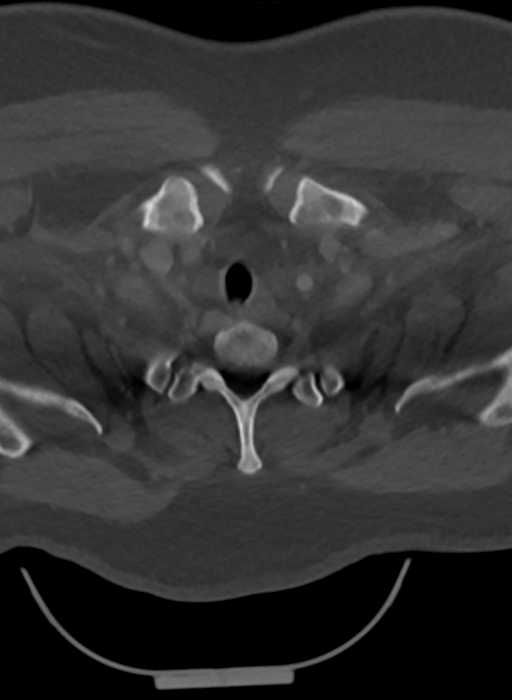
[im 65/130  bone]
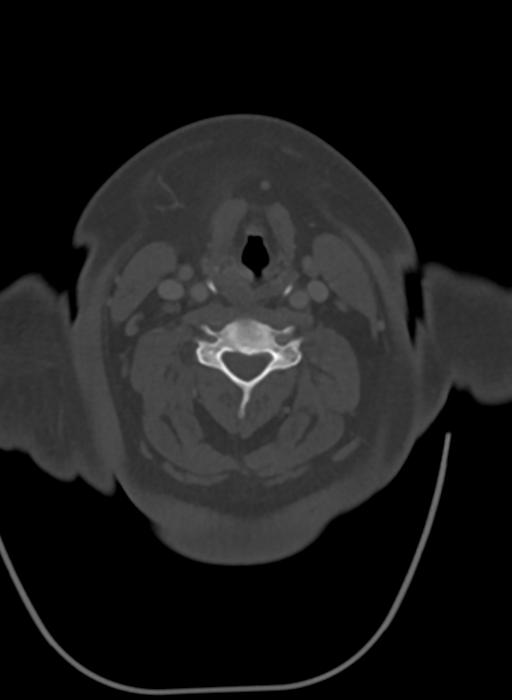
[im 97/130  bone]
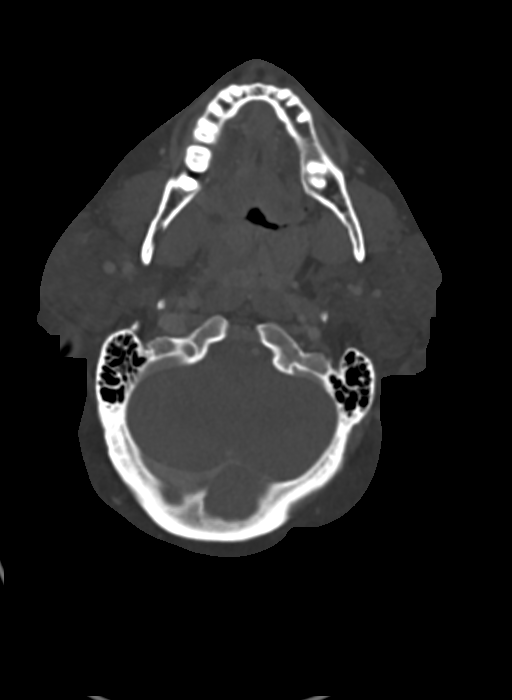

[14 of 33 positions shown; findings below may reference images not displayed]

RADIATION DOSE REDUCTION: This exam was performed according to the
departmental dose-optimization program which includes automated
exposure control, adjustment of the mA and/or kV according to
patient size and/or use of iterative reconstruction technique.

CONTRAST:  100mL OMNIPAQUE IOHEXOL 300 MG/ML  SOLN
FINDINGS: Pharynx and larynx: Thickening of the tonsils with accentuated
enhancement. The right palatine tonsil is asymmetrically thickened
from a 16 mm low-density collection.

Salivary glands: No inflammation, mass, or stone.

Thyroid: Normal.

Lymph nodes: Expected thickening of upper jugular chain lymph nodes.
No nodal cavitation.

Vascular: Negative

Limited intracranial: Negative

Visualized orbits: Negative

Mastoids and visualized paranasal sinuses: Clear

Skeleton: No acute or aggressive process.

Upper chest: Negative.
IMPRESSION: Tonsillitis with 16 mm right peritonsillar abscess.

## 2024-02-02 ENCOUNTER — Other Ambulatory Visit: Payer: Self-pay

## 2024-02-02 ENCOUNTER — Encounter (HOSPITAL_COMMUNITY): Payer: Self-pay | Admitting: *Deleted

## 2024-02-02 ENCOUNTER — Emergency Department (HOSPITAL_COMMUNITY)
Admission: EM | Admit: 2024-02-02 | Discharge: 2024-02-02 | Disposition: A | Discharge status: Home / Self Care | Payer: Self-pay | Attending: Emergency Medicine | Admitting: Emergency Medicine

## 2024-02-02 ENCOUNTER — Emergency Department (HOSPITAL_COMMUNITY): Payer: Self-pay

## 2024-02-02 DIAGNOSIS — R110 Nausea: Secondary | ICD-10-CM | POA: Insufficient documentation

## 2024-02-02 DIAGNOSIS — R42 Dizziness and giddiness: Secondary | ICD-10-CM | POA: Insufficient documentation

## 2024-02-02 DIAGNOSIS — R519 Headache, unspecified: Secondary | ICD-10-CM | POA: Insufficient documentation

## 2024-02-02 LAB — CBC WITH DIFFERENTIAL/PLATELET
Abs Immature Granulocytes: 0.03 K/uL (ref 0.00–0.07)
Basophils Absolute: 0.1 K/uL (ref 0.0–0.1)
Basophils Relative: 1 %
Eosinophils Absolute: 0.3 K/uL (ref 0.0–0.5)
Eosinophils Relative: 3 %
HCT: 49.6 % (ref 39.0–52.0)
Hemoglobin: 17.3 g/dL — ABNORMAL HIGH (ref 13.0–17.0)
Immature Granulocytes: 0 %
Lymphocytes Relative: 33 %
Lymphs Abs: 2.8 K/uL (ref 0.7–4.0)
MCH: 30.8 pg (ref 26.0–34.0)
MCHC: 34.9 g/dL (ref 30.0–36.0)
MCV: 88.4 fL (ref 80.0–100.0)
Monocytes Absolute: 0.6 K/uL (ref 0.1–1.0)
Monocytes Relative: 7 %
Neutro Abs: 4.7 K/uL (ref 1.7–7.7)
Neutrophils Relative %: 56 %
Platelets: 190 K/uL (ref 150–400)
RBC: 5.61 MIL/uL (ref 4.22–5.81)
RDW: 11.8 % (ref 11.5–15.5)
WBC: 8.4 K/uL (ref 4.0–10.5)
nRBC: 0 % (ref 0.0–0.2)

## 2024-02-02 LAB — BASIC METABOLIC PANEL WITH GFR
Anion gap: 13 (ref 5–15)
BUN: 10 mg/dL (ref 6–20)
CO2: 22 mmol/L (ref 22–32)
Calcium: 9.1 mg/dL (ref 8.9–10.3)
Chloride: 100 mmol/L (ref 98–111)
Creatinine, Ser: 0.68 mg/dL (ref 0.61–1.24)
GFR, Estimated: >60 mL/min (ref >60)
Glucose, Bld: 356 mg/dL — ABNORMAL HIGH (ref 70–99)
Potassium: 4.2 mmol/L (ref 3.5–5.1)
Sodium: 135 mmol/L (ref 135–145)

## 2024-02-02 MED ORDER — MECLIZINE HCL 25 MG PO TABS
25.0000 mg | ORAL_TABLET | Freq: Once | ORAL | Status: AC
  Administered 2024-02-02: 25 mg via ORAL
  Filled 2024-02-02: qty 1

## 2024-02-02 MED ORDER — MECLIZINE HCL 25 MG PO TABS: 25.0000 mg | ORAL_TABLET | Freq: Three times a day (TID) | ORAL | 0 refills | Status: AC | PRN

## 2024-02-02 NOTE — ED Provider Triage Note (Signed)
 Emergency Medicine Provider Triage Evaluation Note  Jospeh Mangel , a 43 y.o. male  was evaluated in triage.  Pt complains of dizziness for 2 weeks. States he feels like the room is moving when he changes positions. Has recently started gabapentin and hydroxyzine and feels like his symptoms are worse. Had a fall at work and large posterior scalp hematoma in June. Has had multiple MRIS since that have been negative.   Review of Systems  Positive: Tension headaches, dizziness Negative: Numbness, tingling, falls   Physical Exam  BP (!) 146/95 (BP Location: Right Arm)   Pulse 100   Temp (!) 97.4 F (36.3 C)   Resp 14   Ht 5' 11 (1.803 m)   Wt 127 kg   SpO2 95%   BMI 39.05 kg/m  Gen:   Awake, no distress   Resp:  Normal effort  MSK:   Moves extremities without difficulty   Medical Decision Making  Medically screening exam initiated at 10:27 AM.  Appropriate orders placed.  Davante Marcelus Dubberly was informed that the remainder of the evaluation will be completed by another provider, this initial triage assessment does not replace that evaluation, and the importance of remaining in the ED until their evaluation is complete.     Gennaro Bouchard L, DO 02/02/24 1028

## 2024-02-02 NOTE — ED Triage Notes (Signed)
 States he fell on 06/09 . C/o dizziness and nausea since  fall states its getting worse.

## 2024-02-02 NOTE — ED Provider Notes (Signed)
 [place] EMERGENCY DEPARTMENT AT [facility] Provider Note   CSN: [id number] Arrival date & time: 02/02/24  9057     Patient presents with: Dizziness and Nausea   Joshua Mcneil is a 43 y.o. male.   43 year old male presents with persistent headache and dizziness since having a head injury about 4 months ago.  Has been seen by his doctor for same and was prescribed hydroxyzine, gabapentin.  States that symptoms have been getting worse since then.  Notes that he feels dizzy with certain time position changes.  Denies any focal weakness.  No vision loss.  Denies any ataxia.  Has not had any nausea or vomiting or confusion.  Interpreter used for this encounter       Prior to Admission medications   Medication Sig Start Date End Date Taking? Authorizing Provider  atorvastatin  (LIPITOR) 10 MG tablet Take 1 tablet (10 mg total) by mouth at bedtime. 08/31/21 02/27/22  Lorren Greig PARAS, NP  cyclobenzaprine  (FLEXERIL ) 10 MG tablet Take 1 tablet (10 mg total) by mouth 2 (two) times daily as needed for muscle spasms. 10/01/23   Mannie Fairy DASEN, DO  glipiZIDE  (GLUCOTROL ) 10 MG tablet Take 1 tablet (10 mg total) by mouth 2 (two) times daily before a meal. 06/18/22   Shamleffer, Donell Cardinal, MD  metFORMIN  (GLUCOPHAGE -XR) 500 MG 24 hr tablet Take 2 tablets (1,000 mg total) by mouth 2 (two) times daily with a meal. 06/18/22   Shamleffer, Donell Cardinal, MD  nicotine  (NICODERM CQ  - DOSED IN MG/24 HOURS) 14 mg/24hr patch Place 1 patch (14 mg total) onto the skin daily. Patient not taking: Reported on 06/18/2022 08/31/21   Lorren Greig PARAS, NP  ondansetron  (ZOFRAN ) 4 MG tablet Take 1 tablet (4 mg total) by mouth every 6 (six) hours. 10/01/23   Mannie Fairy T, DO  pantoprazole  (PROTONIX ) 40 MG tablet Take 1 tablet (40 mg total) by mouth daily. 08/31/21 02/27/22  Lorren Greig PARAS, NP  valACYclovir  (VALTREX ) 1000 MG tablet Take 1 tablet (1,000 mg total) by mouth 2 (two) times  daily. Patient not taking: Reported on 06/18/2022 10/26/21   Lorren Greig PARAS, NP    Allergies: Patient has no known allergies.    Review of Systems  All other systems reviewed and are negative.   Updated Vital Signs BP (!) 146/95 (BP Location: Right Arm)   Pulse 100   Temp (!) 97.4 F (36.3 C)   Resp 14   Ht 1.803 m (5' 11)   Wt 127 kg   SpO2 95%   BMI 39.05 kg/m   Physical Exam Vitals and nursing note reviewed.  Constitutional:      General: He is not in acute distress.    Appearance: Normal appearance. He is well-developed. He is not toxic-appearing.  HENT:     Head: Normocephalic and atraumatic.  Eyes:     General: Lids are normal.     Conjunctiva/sclera: Conjunctivae normal.     Pupils: Pupils are equal, round, and reactive to light.  Neck:     Thyroid: No thyroid mass.     Trachea: No tracheal deviation.  Cardiovascular:     Rate and Rhythm: Normal rate and regular rhythm.     Heart sounds: Normal heart sounds. No murmur heard.    No gallop.  Pulmonary:     Effort: Pulmonary effort is normal. No respiratory distress.     Breath sounds: Normal breath sounds. No stridor. No decreased breath sounds, wheezing, rhonchi or rales.  Abdominal:     General: There is no distension.     Palpations: Abdomen is soft.     Tenderness: There is no abdominal tenderness. There is no rebound.  Musculoskeletal:        General: No tenderness. Normal range of motion.     Cervical back: Normal range of motion and neck supple.  Skin:    General: Skin is warm and dry.     Findings: No abrasion or rash.  Neurological:     Mental Status: He is alert and oriented to person, place, and time. Mental status is at baseline.     GCS: GCS eye subscore is 4. GCS verbal subscore is 5. GCS motor subscore is 6.     Cranial Nerves: No cranial nerve deficit.     Sensory: No sensory deficit.     Motor: Motor function is intact.  Psychiatric:        Attention and Perception: Attention normal.         Speech: Speech normal.        Behavior: Behavior normal.     (all labs ordered are listed, but only abnormal results are displayed) Labs Reviewed  CBC WITH DIFFERENTIAL/PLATELET - Abnormal; Notable for the following components:      Result Value   Hemoglobin 17.3 (*)    All other components within normal limits  BASIC METABOLIC PANEL WITH GFR    EKG: None  Radiology: No results found.   Procedures   Medications Ordered in the ED  meclizine (ANTIVERT) tablet 25 mg (has no administration in time range)                                    Medical Decision Making  Patient given Antivert dose.  Did better.  Labs are reassuring.  Head CT without acute findings.  Suspect some element of postconcussive syndrome.  Symptoms also could be related to recent medications that he was started on.  Will discharge home on Antivert     Final diagnoses:  None    ED Discharge Orders     None          Dasie Faden, MD 02/02/24 1358

## 2024-02-02 NOTE — ED Notes (Signed)
 Patient discharged using the video interpreter. Patient given written and oral discharge instructions. Patient verbalized understanding. Patient ambulatory to exit with steady gait. Patient breathing without difficulty. Patient denies questions, concerns or needs at this time.
